# Patient Record
Sex: Male | Born: 1950 | Race: White | Hispanic: No | Marital: Married | Smoking: Never smoker
Health system: Southern US, Community
[De-identification: ages and names within clinical notes are randomized; demographics above are authoritative.]

## PROBLEM LIST (undated history)

## (undated) DIAGNOSIS — G4730 Sleep apnea, unspecified: Secondary | ICD-10-CM

## (undated) DIAGNOSIS — K219 Gastro-esophageal reflux disease without esophagitis: Secondary | ICD-10-CM

## (undated) DIAGNOSIS — M545 Low back pain, unspecified: Secondary | ICD-10-CM

## (undated) DIAGNOSIS — I341 Nonrheumatic mitral (valve) prolapse: Secondary | ICD-10-CM

## (undated) DIAGNOSIS — R0609 Other forms of dyspnea: Secondary | ICD-10-CM

## (undated) DIAGNOSIS — Z049 Encounter for examination and observation for unspecified reason: Secondary | ICD-10-CM

## (undated) DIAGNOSIS — M1990 Unspecified osteoarthritis, unspecified site: Secondary | ICD-10-CM

## (undated) DIAGNOSIS — M2550 Pain in unspecified joint: Secondary | ICD-10-CM

## (undated) DIAGNOSIS — R0600 Dyspnea, unspecified: Secondary | ICD-10-CM

## (undated) DIAGNOSIS — M549 Dorsalgia, unspecified: Secondary | ICD-10-CM

## (undated) DIAGNOSIS — D649 Anemia, unspecified: Secondary | ICD-10-CM

## (undated) DIAGNOSIS — J42 Unspecified chronic bronchitis: Secondary | ICD-10-CM

## (undated) HISTORY — DX: Dyspnea, unspecified: R06.00

## (undated) HISTORY — DX: Gastro-esophageal reflux disease without esophagitis: K21.9

## (undated) HISTORY — DX: Unspecified osteoarthritis, unspecified site: M19.90

## (undated) HISTORY — DX: Unspecified chronic bronchitis: J42

## (undated) HISTORY — DX: Other forms of dyspnea: R06.09

## (undated) HISTORY — DX: Low back pain, unspecified: M54.50

## (undated) HISTORY — DX: Nonrheumatic mitral (valve) prolapse: I34.1

## (undated) HISTORY — DX: Sleep apnea, unspecified: G47.30

## (undated) HISTORY — DX: Dorsalgia, unspecified: M54.9

## (undated) HISTORY — DX: Pain in unspecified joint: M25.50

## (undated) HISTORY — DX: Anemia, unspecified: D64.9

---

## 1991-01-04 DIAGNOSIS — J40 Bronchitis, not specified as acute or chronic: Secondary | ICD-10-CM

## 1991-01-04 HISTORY — DX: Bronchitis, not specified as acute or chronic: J40

## 2001-07-16 ENCOUNTER — Ambulatory Visit: Admit: 2001-07-16 | Disposition: A | Discharge status: Home / Self Care | Payer: Self-pay | Source: Ambulatory Visit

## 2003-01-04 HISTORY — PX: SHOULDER SURGERY: SHX246

## 2004-10-19 ENCOUNTER — Ambulatory Visit: Admission: RE | Admit: 2004-10-19 | Payer: Self-pay | Source: Ambulatory Visit | Admitting: Gastroenterology

## 2005-09-05 ENCOUNTER — Emergency Department: Admit: 2005-09-05 | Payer: Self-pay | Source: Emergency Department | Admitting: Emergency Medical Services

## 2008-12-12 ENCOUNTER — Ambulatory Visit
Admit: 2008-12-12 | Disposition: A | Discharge status: Home / Self Care | Payer: Self-pay | Source: Ambulatory Visit | Admitting: Gastroenterology

## 2009-01-03 DIAGNOSIS — K5790 Diverticulosis of intestine, part unspecified, without perforation or abscess without bleeding: Secondary | ICD-10-CM

## 2009-01-03 HISTORY — DX: Diverticulosis of intestine, part unspecified, without perforation or abscess without bleeding: K57.90

## 2009-01-13 ENCOUNTER — Ambulatory Visit: Payer: Self-pay

## 2009-01-13 ENCOUNTER — Ambulatory Visit: Admission: RE | Admit: 2009-01-13 | Payer: Self-pay | Source: Ambulatory Visit | Admitting: Gastroenterology

## 2010-12-21 NOTE — Op Note (Signed)
Introduction:[id number] Document ID: [id number] -- 60 year old male      patient presents for an outpatient Esophagogastroduodenoscopy on      01/13/2009.            Indications: GERD (530.81). Anemia.            Consent: The benefits, risks, and alternatives to the procedure were      discussed and informed consent was obtained from the patient.            Preparation: EKG, pulse, pulse oximetry, and blood pressure were monitored      throughout the procedure.            Medications: IVA anesthesia.            Procedure: The gastroscope was passed through the mouth under direct      visualization and was advanced with ease to the 2nd portion of the      duodenum. The scope was withdrawn and the mucosa was carefully examined.      The views were excellent. The patient's toleration of the procedure was      excellent.            Estimated Blood Loss: Negligible.            Findings:   Esophagus: A possible tongue of short-segment Barrett's      esophagus was found in the GE junction, spanning 10 mm.  Two cold forceps      biopsies were taken. There was a small hiatus hernia visible in the      esophagus.   Stomach: Mild non-erosive gastritis was found in the antrum      and body of the stomach. The mucosa appeared erythematous.  Two cold      forceps biopsies were taken.  Pylorus: The pylorus appeared to be normal,      symmetrical, and patent.  Duodenum: The duodenum appeared to be normal.            Unplanned Events: There were no unplanned events.            Summary: Barrett's esophagus was noted in the GE junction (530.85). Two      biopsies taken. A hiatus hernia was found in the esophagus (553.3). Mild      non-erosive gastritis was found in the antrum and body of the stomach      (535.50). Two biopsies taken. Normal, symmetrical, and patent pylorus.      Normal duodenum.            Recommendations: Raise the head of the bed 4 to 6 inches. Avoid smoking.      Avoid excess coffee, tea or other caffeinated  beverages. Avoid garments      that fit tightly through the abdomen. Avoid eating before bed. Continue      current medications as directed by the physician. EGD recommended in 2      years. Follow-up appointment with Niel Hummer, MD in 2-4 week.            Procedure Codes:            Performed By:      Version 1, electronically signed by Dr. Niel Hummer on 01/13/2009 at 11:50.

## 2010-12-21 NOTE — Op Note (Signed)
Introduction: [id number] Document ID: [id number] -- 60 year old male      patient presents for an outpatient Colonoscopy on 01/13/2009.            Indications: Screening for personal history of polyps (V12.72). Anemia.            Consent: The benefits, risks, and alternatives to the procedure were      discussed and informed consent was obtained from the patient.            Preparation: EKG, pulse, pulse oximetry, and blood pressure were monitored      throughout the procedure.            Medications: IVA anesthesia.            Rectal Exam: Normal rectal exam.            Procedure: The colonoscope was passed through the anus under direct      visualization and was advanced with ease to the cecum, confirmed by      landmarks. The scope was withdrawn and the mucosa was carefully examined.      The quality of the preparation was excellent. The views were excellent.      The patient's toleration of the procedure was excellent. Retroflexion was      performed in the rectum.            Estimated Blood Loss: Negligible.            Findings: There was evidence of moderately severe diverticulosis in the      sigmoid colon.  Small internal hemorrhoids were found. The hemorrhoids      were not bleeding.  Otherwise, the colon appeared to be normal.            Unplanned Events: There were no unplanned events.            Summary: Moderately severe diverticulosis (562.10) found in the sigmoid      colon. Internal hemorrhoids found (455.0).            Recommendations: Start high fiber diverticulosis diet that includes no      popcorn, seeds, or nuts. Follow-up appointment with Niel Hummer, MD in 2      weeks. Colonoscopy recommended in 5 years. Patient will be sent a reminder      letter.            Procedure Codes:            Performed By:      Version 1, electronically signed by Dr. Niel Hummer on 01/13/2009 at 12:08.

## 2011-01-04 HISTORY — PX: SHOULDER ARTHROSCOPY: SHX128

## 2011-01-04 HISTORY — PX: KNEE ARTHROSCOPY: SUR90

## 2011-10-10 ENCOUNTER — Ambulatory Visit: Payer: Enrolled Prime—HMO | Admitting: Rehabilitative and Restorative Service Providers"

## 2011-10-10 ENCOUNTER — Inpatient Hospital Stay
Payer: Enrolled Prime—HMO | Attending: Orthopaedic Surgery | Admitting: Rehabilitative and Restorative Service Providers"

## 2011-10-10 DIAGNOSIS — M25519 Pain in unspecified shoulder: Secondary | ICD-10-CM | POA: Insufficient documentation

## 2011-10-10 NOTE — PT/OT Therapy Note (Signed)
DAILY NOTE    Time In/Out: 6: 30- 7:25 Total Time: 45 Visit Number:  9    # of Authorized Visits: 16 Visit #: 1    Diagnosis (Treating/Medical): The encounter diagnosis was Shoulder joint pain.          Subjective:  Mike Scott's pain is Increases with certain Shld movement and is rated a 6/10.      Objective:   Treatment:  Therapeutic Exercise:Per flowsheet to improve: Strength     Modifications/Patient Education (Verbal and Tactile cues with correct form and to decrease momentum with ther- ex for improved muscle control.  )        Manual Therapy:  Myofascial release to increase muscle flexibility-  Rotator cuff muscle and tendon. Cross friction massage-bicep tendon to decrease pain.          Current Measurements (ROM, Strength, Girth, Outcomes, etc.):   InitialRight  AROM InitialRight  PROM   Right  AROM   Right PROM Shoulder InitialLeft AROM InitialLeft PROM   Left AROM   Left PROM   150  Deg ( last check)  160  Flexion       NT  NT  Extension       120  130  Abduction       T12  NT  Internal Rotation       82 deg  85  External Rotation                  (blank fields were intentionally left blank)  Initial R   R  Initial  L   L    3/5( pain) Shoulder Flexion      4-/5 Shoulder Abduction      NT Shoulder Extension      4-/5 Shoulder External Rotation      4/5 Shoulder Internal Rotation      NT Rhomboids      NT Middle Trapezius      NT Lower Trapezius      NT Biceps      NT Triceps     (blank fields were intentionally left blank)      Modalities: Ice Pack 10 min. Location RIGHT SHLD Position Supine and None  Therapy Rationale: Decrease Pain and Decrease Inflamation         Assessment (response to treatment) No complaint of increased sx after RX.        Progress towards functional goals: Pt with improving shoulder AROM needed for reaching high shelf and back for washing.    Patient requires continued skilled care to: increase shoulder strength for increased lifting tolerance.        Plan:  Continue with Plan of  Care UBE        Mike Scott, PT, DPT, [place] [address]  10/10/2011

## 2011-10-10 NOTE — PT/OT Exercise Plan (Signed)
Name: Mike Scott  Referring Physician: Mosetta Putt*  Diagnosis:   Encounter Diagnosis   Name Primary?   . Shoulder joint pain Yes    ICD 9 Code:  Shoulder pain    Precautions:   Date of Surgery:   MD Follow-up:           Exercise Flow Sheet    Exercise Specifics 10/7 Date            Shld flexion   Till 90 deg. X 10 1# bn             Shld scaption   Till 90 deg X 10 1#  bn             shld theraband ER    Red x 20   bn             Prone mid trap    X 10   bn             Prone rows    X 10   bn             Serratus punches    3# x 10 bn                                                                                                  Home Exercise Program                 (Initials = supervised exercise by clinician)

## 2011-10-14 ENCOUNTER — Inpatient Hospital Stay
Payer: Enrolled Prime—HMO | Attending: Orthopaedic Surgery | Admitting: Rehabilitative and Restorative Service Providers"

## 2011-10-14 DIAGNOSIS — M25519 Pain in unspecified shoulder: Secondary | ICD-10-CM | POA: Insufficient documentation

## 2011-10-14 NOTE — PT/OT Therapy Note (Signed)
DAILY NOTE    Time In/Out: 4: 30- 5:15 Total Time: 45 Visit Number:  10    # of Authorized Visits: 16 Visit #: 2    Diagnosis (Treating/Medical): There were no encounter diagnoses.          Subjective:  Mike Scott's pain is Increases with certain Shld movement and is rated a 4 /10.      Objective:   Treatment:  Therapeutic Exercise:Per flowsheet to improve: Strength     Modifications/Patient Education (Verbal and Tactile cues with correct muscle activation of scapular muscle with prone exercise and to avoid scapular elevation substitution with shoulder flexion and scaption exercise.).        Manual Therapy:  Myofascial release to increase muscle flexibility-  Rotator cuff muscle and tendon. Cross friction massage-bicep tendon to decrease pain.          Current Measurements (ROM, Strength, Girth, Outcomes, etc.):   + Neer test-right   Shoulder        Modalities: Ice Pack 10 min. Location RIGHT SHLD Position Supine and None  Therapy Rationale: Decrease Pain and Decrease Inflamation         Assessment (response to treatment) No complaint of increased sx after RX.        Progress towards functional goals:decreased pain level for increased   lifting tolerance.    Patient requires continued skilled care to: increase shoulder strength for increased lifting tolerance.        Plan:  Continue with Plan of Care - possible utra sound        [name], PT, DPT, [place] [address]  10/14/2011

## 2011-10-14 NOTE — PT/OT Exercise Plan (Signed)
Name: Mike Scott  Referring Physician: Erin Hearing Rober*  Diagnosis:   No diagnosis found. ICD 9 Code:  Shoulder pain    Precautions:   Date of Surgery:   MD Follow-up:           Exercise Flow Sheet    Exercise Specifics 10/7 10/11            Shld flexion   Till 90 deg. X 10 1# bn X 10 x 2set 1# bn            Shld scaption   Till 90 deg X 10 1#  bn X 10 x 2set 1# bn            shld theraband ER    Red x 20   bn Red x 20 bn            Prone mid trap    X 10   bn X 10 bn            Prone rows    X 10   bn X 10 bn            Serratus punches    3# x 10 bn 3# x 10 bn            UBE      5 min CW/CCW                                                                                Home Exercise Program                 (Initials = supervised exercise by clinician)

## 2011-10-20 ENCOUNTER — Inpatient Hospital Stay
Payer: Enrolled Prime—HMO | Attending: Orthopaedic Surgery | Admitting: Rehabilitative and Restorative Service Providers"

## 2011-10-20 DIAGNOSIS — M25519 Pain in unspecified shoulder: Secondary | ICD-10-CM | POA: Insufficient documentation

## 2011-10-20 NOTE — PT/OT Therapy Note (Signed)
DAILY NOTE    Time In/Out: 6:30- 7:30 Total Time: 45 Visit Number:  11    # of Authorized Visits: 16 Visit #: 3    Diagnosis (Treating/Medical): The encounter diagnosis was Shoulder joint pain.          Subjective:  Mike Scott's pain is improving, easier to lift      Objective:   Treatment:  Therapeutic Exercise:Per flowsheet to improve: Strength     Modifications/Patient Education (Verbal and Tactile cues with correct muscle activation of scapular muscle with prone exercise and to avoid scapular elevation substitution with shoulder flexion and scaption exercise.) Increased     resistance with there-ex to increase shoulder strength for increased   lifting tolerance. Added lower trap strengthening exercise for improving scapular strength for decreased right shoulder joint stress.          Manual Therapy:  Myofascial release to increase muscle flexibility-  Rotator cuff muscle and tendon. Cross friction massage-bicep tendon to decrease pain.          Current Measurements (ROM, Strength, Girth, Outcomes, etc.):   MMT- right                   MMT- left    Mid trap- 3+/5             Mid trap- 4-/5  Rhomboid- 4-/5          Rhomboid- 4/5  Low trap- 3-/5             Low trap- 3+/5        Modalities: Ice Pack 10 min. Location RIGHT SHLD Position Supine and None  Therapy Rationale: Decrease Pain and Decrease Inflamation         Assessment (response to treatment)  C/o shoulder muscle soreness with increased resistance with ther-ex.        Progress towards functional goals:decreased pain level for increased   lifting tolerance, see subjective    Patient requires continued skilled care to: increase shoulder strength for increased lifting tolerance.        Plan:  Continue with Plan of Care -         [name], PT, DPT, [place] [address]  10/20/2011

## 2011-10-20 NOTE — PT/OT Exercise Plan (Signed)
Name: Mike Scott  Referring Physician: Mosetta Putt*  Diagnosis:   Encounter Diagnosis   Name Primary?   . Shoulder joint pain Yes    ICD 9 Code:  Shoulder pain    Precautions:   Date of Surgery:   MD Follow-up:           Exercise Flow Sheet    Exercise Specifics 10/7 10/11 10/20/11           Shld flexion   Till 90 deg. X 10 1# bn X 10 x 2set 1# bn X 10 2# bn           Shld scaption   Till 90 deg X 10 1#  bn X 10 x 2set 1# bn X 10 x 2# bn           shld theraband ER    Red x 20   bn Red x 20 bn sidelying ER- 2# x10             Prone mid trap    X 10   bn X 10 bn X 10 1# bn           Prone rows    X 10   bn X 10 bn X 10 2# bn           Serratus punches    3# x 10 bn 3# x 10 bn 3# x10 bn           UBE      5 min CW/CCW 5 min CW/CCW           Prone low trap      X 5                                                              Home Exercise Program                 (Initials = supervised exercise by clinician)

## 2011-10-21 ENCOUNTER — Ambulatory Visit: Payer: Enrolled Prime—HMO | Admitting: Rehabilitative and Restorative Service Providers"

## 2011-10-24 ENCOUNTER — Inpatient Hospital Stay
Payer: Enrolled Prime—HMO | Attending: Orthopaedic Surgery | Admitting: Rehabilitative and Restorative Service Providers"

## 2011-10-24 DIAGNOSIS — M25519 Pain in unspecified shoulder: Secondary | ICD-10-CM | POA: Insufficient documentation

## 2011-10-24 NOTE — PT/OT Therapy Note (Signed)
DAILY NOTE    Time In/Out: 6:30- 7:30 Total Time: 45 Visit Number:  12    # of Authorized Visits: 9 Visit #: 1    Diagnosis (Treating/Medical): The encounter diagnosis was Shoulder joint pain.          Subjective:  Mike Scott's pain is improving, 2/10      Objective:   Treatment:  Therapeutic Exercise:Per flowsheet to improve: Strength     Modifications/Patient Education (Verbal and Tactile cues with avoiding elbow flexion with serratus punches and correct angle with scapular strengthening exercise)        Manual Therapy:  Myofascial release to increase muscle flexibility-  Rotator cuff muscle and tendon. Cross friction massage-bicep tendon to decrease pain.          Current Measurements (ROM, Strength, Girth, Outcomes, etc.):         Modalities: Ice Pack 10 min. Location RIGHT SHLD Position Supine and None  Therapy Rationale: Decrease Pain and Decrease Inflamation         Assessment (response to treatment)    Only c/o soreness with lower trap strengthening exercise.      Progress towards functional goals:    Patient requires continued skilled care to: increase shoulder strength for increased lifting tolerance.        Plan:  Continue with Plan of Care - flex bar        [name], PT, DPT, [place] [address]  10/24/2011

## 2011-10-24 NOTE — PT/OT Exercise Plan (Signed)
Name: Mike Scott  Referring Physician: Mosetta Putt*  Diagnosis:   Encounter Diagnosis   Name Primary?   . Shoulder joint pain Yes    ICD 9 Code:  Shoulder pain    Precautions:   Date of Surgery:   MD Follow-up:           Exercise Flow Sheet    Exercise Specifics 10/7 10/11 10/20/11 10/24/11          Shld flexion   Till 90 deg. X 10 1# bn X 10 x 2set 1# bn X 10 2# bn X 10 2# bn          Shld scaption   Till 90 deg X 10 1#  bn X 10 x 2set 1# bn X 10 x 2# bn x 10 x 2# bn          shld theraband ER    Red x 20   bn Red x 20 bn sidelying ER- 2# x10   sidelying ER- 2# x10          Prone mid trap    X 10   bn X 10 bn X 10 1# bn X 10 1# bn          Prone rows    X 10   bn X 10 bn X 10 2# bn X 10 2# bn          Serratus punches    3# x 10 bn 3# x 10 bn 3# x10 bn 3# x10 bn          UBE      5 min CW/CCW 5 min CW/CCW 5 min CW/CCW          Prone low trap      X 5 X 5                                                             Home Exercise Program                 (Initials = supervised exercise by clinician)

## 2011-10-27 ENCOUNTER — Inpatient Hospital Stay
Payer: Enrolled Prime—HMO | Attending: Orthopaedic Surgery | Admitting: Rehabilitative and Restorative Service Providers"

## 2011-10-27 DIAGNOSIS — M25519 Pain in unspecified shoulder: Secondary | ICD-10-CM | POA: Insufficient documentation

## 2011-10-27 NOTE — PT/OT Exercise Plan (Signed)
Name: Mike Scott  Referring Physician: Mosetta Putt*  Diagnosis:   Encounter Diagnosis   Name Primary?   . Shoulder joint pain Yes    ICD 9 Code:  Shoulder pain    Precautions:   Date of Surgery:   MD Follow-up:           Exercise Flow Sheet    Exercise Specifics 10/7 10/11 10/20/11 10/24/11 10/27/11         Shld flexion   Till 90 deg. X 10 1# bn X 10 x 2set 1# bn X 10 2# bn X 10 2# bn X 20 2# bn         Shld scaption   Till 90 deg X 10 1#  bn X 10 x 2set 1# bn X 10 x 2# bn x 10 x 2# bn X 20 2#bn         shld theraband ER    Red x 20   bn Red x 20 bn sidelying ER- 2# x10   sidelying ER- 2# x10 2#x 20 bn         Prone mid trap    X 10   bn X 10 bn X 10 1# bn X 10 1# bn X 10 1# bn         Prone rows    X 10   bn X 10 bn X 10 2# bn X 10 2# bn X 20 2# bn         Serratus punches    3# x 10 bn 3# x 10 bn 3# x10 bn 3# x10 bn 3#x 20 bn         UBE      5 min CW/CCW 5 min CW/CCW 5 min CW/CCW 5 min CW/CCW         Prone low trap      X 5 bn X 5 bn X 5 bn         Alphabet on ball         1 set bn           Flex bar      30 sec bn                          Home Exercise Program                 (Initials = supervised exercise by clinician)

## 2011-10-27 NOTE — PT/OT Therapy Note (Signed)
DAILY NOTE    Time In/Out: 6:30- 7:15 Total Time: 45 Visit Number:  13    # of Authorized Visits: 9 Visit #: 13    Diagnosis (Treating/Medical): The encounter diagnosis was Shoulder joint pain.          Subjective:  Mike Scott's shoulder pain doing pretty well      Objective:   Treatment:  Therapeutic Exercise:Per flowsheet to improve: Strength     Modifications/Patient Education (Verbal and Tactile cues minimal) .Added flex bar    Manual Therapy:  Myofascial release to increase muscle flexibility-  Rotator cuff muscle and tendon. Cross friction massage-bicep tendon to decrease pain.          Current Measurements (ROM, Strength, Girth, Outcomes, etc.):   MMT Right shoulder  Shoulder Flexion-4-/5 ( last check-3/5( pain) )     Shoulder Abduction -4-/5  To 4/5 ( last check-4-/5 )  Shoulder External Rotation   4-/5 to 4/5  ( last check-4-/5 )                   Assessment (response to treatment)    No complaint of increased sx after RX.      Progress towards functional goals: The patient with increased shoulder strength for increased lifting tolerance.      Patient requires continued skilled care to:      Plan:  Continue with Plan of Care -NV possible d/c        [name], PT, DPT, [place] [address]  10/27/2011

## 2011-10-31 ENCOUNTER — Inpatient Hospital Stay
Payer: Enrolled Prime—HMO | Attending: Orthopaedic Surgery | Admitting: Rehabilitative and Restorative Service Providers"

## 2011-10-31 DIAGNOSIS — M25519 Pain in unspecified shoulder: Secondary | ICD-10-CM | POA: Insufficient documentation

## 2011-10-31 NOTE — PT/OT Therapy Note (Signed)
DAILY NOTE    Time In/Out: 6:45- 7:30 Total Time: 45 Visit Number:  14    # of Authorized Visits: 9 Visit #: 14    Diagnosis (Treating/Medical): The encounter diagnosis was Shoulder joint pain.          Subjective:  Mike Scott's shoulder pain doing pretty well- 0-4/10      Objective:   Treatment:  Therapeutic Exercise:Per flowsheet to improve: Strength     Modifications/Patient Education (Verbal and Tactile cues minimal- to avoid shrugging substitution with shoulder flexion and scaption strengthening exercise).  To continue with HEP. Discussed progression in terms of reps and resistance in the future with HEP.    Manual Therapy:  Myofascial release to increase muscle flexibility-  Rotator cuff muscle and tendon. Cross friction massage-bicep tendon to decrease pain.          Current Measurements (ROM, Strength, Girth, Outcomes, etc.):   MMT Right shoulder  Shoulder Flexion-4-/5 ( last check-3/5( pain) )     Shoulder Abduction -4-/5  To 4/5 ( last check-4-/5 )  Shoulder External Rotation   4-/5 to 4/5  ( last check-4-/5 )                   Assessment (response to treatment)    1/10 after rx.      Progress towards functional goals: The patient able to lift gallon of milk with no  issues to fridge..      : Initial SPADI was 60%.  Final SPADI improved to 14%.  Final Pain Rating decreased from 17% to 17% and Satisfaction improved from 0/10 to  5/10.   Patient reports performing HEP some of the time.       Plan:  Discharged  from PT -goals met        [name], PT, DPT, [place] [address]  10/31/2011

## 2011-10-31 NOTE — PT/OT Exercise Plan (Signed)
Name: Mike Scott  Referring Physician: Mosetta Putt*  Diagnosis:   Encounter Diagnosis   Name Primary?   . Shoulder joint pain Yes    ICD 9 Code:  Shoulder pain    Precautions:   Date of Surgery:   MD Follow-up:           Exercise Flow Sheet    Exercise Specifics 10/7 10/11 10/20/11 10/24/11 10/27/11 10/31/11        Shld flexion   Till 90 deg. X 10 1# bn X 10 x 2set 1# bn X 10 2# bn X 10 2# bn X 20 2# bn X 20 2# bn        Shld scaption   Till 90 deg X 10 1#  bn X 10 x 2set 1# bn X 10 x 2# bn x 10 x 2# bn X 20 2#bn X 20 2#bn        shld theraband ER    Red x 20   bn Red x 20 bn sidelying ER- 2# x10   sidelying ER- 2# x10 2#x 20 bn 2#x 20 bn        Prone mid trap    X 10   bn X 10 bn X 10 1# bn X 10 1# bn X 10 1# bn X 10 1# bn        Prone rows    X 10   bn X 10 bn X 10 2# bn X 10 2# bn X 20 2# bn X 20 2# bn        Serratus punches    3# x 10 bn 3# x 10 bn 3# x10 bn 3# x10 bn 3#x 20 bn 3#x 20 bn        UBE      5 min CW/CCW 5 min CW/CCW 5 min CW/CCW 5 min CW/CCW 5 min CW/CCW        Prone low trap      X 5 bn X 5 bn X 5 bn X 5 bn        Alphabet on ball         1 set bn 1 set bn          Flex bar      30 sec bn 30 sec bn                         Home Exercise Program                 (Initials = supervised exercise by clinician)

## 2011-12-12 ENCOUNTER — Inpatient Hospital Stay: Payer: Enrolled Prime—HMO | Attending: Internal Medicine | Admitting: Rehabilitative and Restorative Service Providers"

## 2011-12-12 ENCOUNTER — Encounter: Payer: Self-pay | Admitting: Rehabilitative and Restorative Service Providers"

## 2011-12-12 VITALS — BP 145/88 | HR 74

## 2011-12-12 DIAGNOSIS — M545 Low back pain, unspecified: Secondary | ICD-10-CM | POA: Insufficient documentation

## 2011-12-12 NOTE — PT/OT Therapy Note (Signed)
INITIAL EVALUATION (Lumbar)      Name: Mike Scott Age: 61 y.o. Occupation: desk job SOC: 12/12/2011  Referring Physician: Maralyn Sago,* MD recheck: tbd DOS:   DOI: Onset of Problem / Injury: 11/21/11  Diagnosis (Treating/Medical): The encounter diagnosis was Low back pain.   # of Authorized Visits: 24 Visit # 1 today     SUBJECTIVE:    Mechanism of Injury: 11/21/11- hiking hills. Stuck on top- wrong way, had to use ropes with UE and slipper slopes. Right sided Low back pain.  Severe for 1 week, could not put on the pants comfortably. Much better now.      Patient's reason for seeking PT /Goal:    Past Medical History: No past medical history on file.  Medications: No outpatient prescriptions have been marked as taking for the 12/12/11 encounter (Clinical Support) with [name], PT.   Fall Risks: Low Fall Risk Comments: none 2013  Other Treatment/Prior Therapy: Yes -LB/ knee and shoulder  Prior Hospitalization: Yes - [facility] for shoulder surgery and knee surgery  Hand Dominance: Dominant Hand: Right Involved Side: Involved Side: Right   DiagnosticTests:    Outcome Measure:         Oswestry Score: 12 %          % PSFS Score: 23 % Rate Satisfaction with Current Function: 7/10  PLOF: No limitation with sitting in any chair.   Living Environment: Type of Residence: Multi-story home      Dwelling Entrance:     Patient lives with: Living Arrangements: Spouse/significant other    OBJECTIVE:    Vitals: BP: 145/88 mmHg Heart Rate: 74         Observation/Posture/Gait/Integumentary  Observation:   Posture: Deficits noted: Forward Head and Rounded Shoulders  Gait: Within functional limits  Integumentary: No wound, lesion or rash noted     AROM Hip  WFL   AROM: Lumbar Spine Pain  R L   Flexion 60  0  Flexion     Extension 10  0  Extension      R L R L Abduction     Side Bending 5 5 0  Exter. Rot.     Rotation     Inter. Rot.     (blank fields were intentionally left blank)    Repeated flexion/extension  centralizes symptoms? N/A    STRENGTH   Lumbar   MMT /5    IE R IE L R L  IE R IE  L R  L   L1/2 Hip Flex 4+/5 4+/5   Glute Medius       L3 Knee Ext 4+/5 4+/5    T.A. Activation fair      L4 Ankle DF 4+/5 4+/5   Heel/Toe Walk       L5 Toe Ex 4+/5 4+/5          S1 Knee Flex            (blank fields were intentionally left blank)    Palpation: mild tightness- L-PSM     R L   SLR NT NT   Crossed SLR NT NT   Quadrant Test NT NT   Thomas Test (+) (+)   OBER Test NT NT   Slump NT NT   FABER NT NT   SAG Sign NT NT   Hamstring Flexibility (+) (+)    -40 deg -40 deg     Treatment Initial Visit:  Evaluation  Manual-  Myofascial release to  increase muscle flexibility- L-PSM- 10'    Barriers to rehabilitation: None  Rehab Potential:good  Is patient aware of diagnosis: Yes  For Next Visit Add  Ther-ex    [name], PT, DPT, [place] [address]  12/12/2011  Time In/Out: 4:50- 5:30   Total Treatment Time:  40

## 2011-12-12 NOTE — PT/OT Plan of Care (Signed)
Plan of Care / Updated Plan of Care IPTC Medicare Provider #: [phone]                    Patient Name: Mike Scott  MRN: [id number]  DOI: Onset of Problem / Injury: 11/21/11 DOS: N/A  SOC: 12/12/2011         Diagnosis: The encounter diagnosis was Low back pain.  ICD-9 code:     ASSESSMENT: the patient is a 61 y.o. male who requires Physical Therapy for the following:  Impairments:  increased LBP, decreased LE muscle flexibility, decreased Abdominal muscle control (T.A. Activation)    Functional Limitations: right sided LBP. Not comfortable in sitting all chairs    Plan Of Care: Therapeutic Exercise and Soft Tissue/Joint Mobilization - Myofascial release to increase muscle flexibility      Frequency/Duration: 2 times a week for 2 weeks. Anticipated D/C date: 12/27/11    Goals:  Date (Body Area, Impairment Goal, Functional   Activity, Target Performance) Time Frame Status Date/  Initial   12/12/2011   Patient will demonstrate independence in prescribed HEP with proper form, sets and reps for safe discharge to an independent program.  2 weeks     12/12/2011  Decreased LBP 0-1/10 with increased Abdominal muscle control (T.A. Activation)- Fair+, improved LE muscle flexibility for ability to sit in all chairs without any difficulty 2weeks      Signature: [name], PT, DPT, [place] [address] Date: 12/12/2011    Signature: Maralyn Sago,* ____________________________ Date:

## 2011-12-15 ENCOUNTER — Inpatient Hospital Stay
Payer: Enrolled Prime—HMO | Attending: Orthopaedic Surgery | Admitting: Rehabilitative and Restorative Service Providers"

## 2011-12-15 DIAGNOSIS — M25519 Pain in unspecified shoulder: Secondary | ICD-10-CM | POA: Insufficient documentation

## 2011-12-15 NOTE — PT/OT Therapy Note (Signed)
DAILY NOTE    Time In/Out: 4:50 - 5:30 Total Time: 40 Visit Number:  2    Cer# of Authorized Visits: 24 Visit #: 2    Diagnosis (Treating/Medical): The encounter diagnosis was Low back pain.          Subjective:  Mike Scott's  No new concerns expressed  Functional Changes:     Objective:   Treatment:  Therapeutic Exercise:Per flowsheet to improve: Flexibility/ROM and Stabilization. Given copies      Modifications/Patient Education:         Manual Therapy:     Myofascial release to increase muscle flexibility - L-PSM/glut muscle-medius       Current Measurements (ROM, Strength, Girth, Outcomes, etc.):            Assessment (response to treatment); No complaint of increased sx after RX.        Progress towards functional goals:     Patient requires continued skilled care to:     Plan:  Continue with Plan of Care-  progress abdominal stabilization exercises, SLR abduction, IT band stretch      Mike Scott, PT, DPT, [place] [address]  12/15/2011

## 2011-12-15 NOTE — PT/OT Exercise Plan (Signed)
Name: Mike Scott  Referring Physician: Maralyn Sago,*  Diagnosis:   Encounter Diagnosis   Name Primary?   . Low back pain Yes        Precautions:  right TKR/ shoulder arthroscopic surgery Date of Surgery:   na MD Follow-up: TBD          Exercise Flow Sheet    Exercise Specifics 12/15/11 Date            Piriformis stretch      30 sec x 3 rep bn               Hamstring stretch with strap      30 sec x 3 rep bn                 Hip abduction/ adduction isometrics   with knee bend supine    10 sec x  10 rep bn                 Thomas hip flexor stretch    30 sec x 3 rep bn                                                                                                                                      Home Exercise Program                 (Initials = supervised exercise by clinician)

## 2011-12-19 ENCOUNTER — Inpatient Hospital Stay
Payer: Enrolled Prime—HMO | Attending: Orthopaedic Surgery | Admitting: Rehabilitative and Restorative Service Providers"

## 2011-12-19 DIAGNOSIS — M25519 Pain in unspecified shoulder: Secondary | ICD-10-CM | POA: Insufficient documentation

## 2011-12-19 NOTE — PT/OT Exercise Plan (Signed)
Name: Garfield Cornea  Referring Physician: Maralyn Sago,*  Diagnosis:   Encounter Diagnosis   Name Primary?   . Low back pain Yes        Precautions:  right TKR/ shoulder arthroscopic surgery Date of Surgery:   na MD Follow-up: TBD          Exercise Flow Sheet    Exercise Specifics 12/15/11 12/19/11            Piriformis stretch      30 sec x 3 rep bn   30 sec x 3 rep bn              Hamstring stretch with strap      30 sec x 3 rep bn   30 sec x 3 rep bn                Hip abduction/ adduction isometrics   with knee bend supine    10 sec x  10 rep bn   hep              Thomas hip flexor stretch    30 sec x 3 rep bn   30 sec x 3 rep bn                SLR flexion/abduction   X 10 3# bn/ x 10 2# bn            Knee to chest       30 sec x 3 rep bn                Abdominal muscle brace with LE cycle   1' bn              sidelying ITB stretch   2' bn                                                               Home Exercise Program                 (Initials = supervised exercise by clinician)

## 2011-12-19 NOTE — PT/OT Therapy Note (Signed)
DAILY NOTE    Time In/Out: 4:50 - 5:30 Total Time: 40 Visit Number:  3    Cer# of Authorized Visits: 24 Visit #: 3    Diagnosis (Treating/Medical): The encounter diagnosis was Low back pain.          Subjective:  Mike Scott's  Ready to be d/c to HEP. Very minimal , intermittent pain    Functional Changes:     Objective:   Treatment:  Therapeutic Exercise:Per flowsheet to improve: Flexibility/ROM and Stabilization. Note additional exercise, given copies   progress abdominal stabilization exercises, SLR abduction, IT band stretch.  To continue with HEP/ posture awareness; discussed progression with HEP in terms of reps and resistance.             Manual Therapy:     Myofascial release to increase muscle flexibility - L-PSM/glut muscle-medius/ hip flexor muscles       Current Measurements (ROM, Strength, Girth, Outcomes, etc.):     T.A. Muscle activation- Fair       Assessment (response to treatment);  Minimal c/o symptom 1/10 after rx.        Progress towards functional goals:  Able to sit in chairs as long as watching proper posture         Plan: d/c'd per  the patient request        [name], PT, DPT, [place] [address]  12/19/2011

## 2011-12-22 ENCOUNTER — Ambulatory Visit: Payer: Enrolled Prime—HMO | Admitting: Rehabilitative and Restorative Service Providers"

## 2011-12-26 ENCOUNTER — Ambulatory Visit: Payer: Enrolled Prime—HMO | Admitting: Rehabilitative and Restorative Service Providers"

## 2011-12-29 ENCOUNTER — Ambulatory Visit: Payer: Enrolled Prime—HMO

## 2011-12-29 NOTE — Pre-Procedure Instructions (Signed)
No labs required per anesthesia

## 2012-01-05 ENCOUNTER — Encounter
Admission: RE | Disposition: A | Discharge status: Home / Self Care | Payer: Self-pay | Source: Ambulatory Visit | Attending: Gastroenterology

## 2012-01-05 ENCOUNTER — Encounter: Payer: Self-pay | Admitting: Anesthesiology

## 2012-01-05 ENCOUNTER — Ambulatory Visit
Admission: RE | Admit: 2012-01-05 | Discharge: 2012-01-05 | Disposition: A | Discharge status: Home / Self Care | Payer: Enrolled Prime—HMO | Source: Ambulatory Visit | Attending: Gastroenterology | Admitting: Gastroenterology

## 2012-01-05 ENCOUNTER — Ambulatory Visit: Payer: Enrolled Prime—HMO | Admitting: Anesthesiology

## 2012-01-05 ENCOUNTER — Ambulatory Visit: Payer: Enrolled Prime—HMO | Admitting: Gastroenterology

## 2012-01-05 ENCOUNTER — Ambulatory Visit: Payer: Self-pay

## 2012-01-05 DIAGNOSIS — K219 Gastro-esophageal reflux disease without esophagitis: Secondary | ICD-10-CM | POA: Insufficient documentation

## 2012-01-05 DIAGNOSIS — K2940 Chronic atrophic gastritis without bleeding: Secondary | ICD-10-CM | POA: Insufficient documentation

## 2012-01-05 HISTORY — DX: Encounter for examination and observation for unspecified reason: Z04.9

## 2012-01-05 HISTORY — PX: EGD: SHX3789

## 2012-01-05 SURGERY — DONT USE, USE 1095-ESOPHAGOGASTRODUODENOSCOPY (EGD), DIAGNOSTIC
Anesthesia: Anesthesia General

## 2012-01-05 MED ORDER — LACTATED RINGERS IV SOLN
INTRAVENOUS | Status: DC | PRN
Start: 2012-01-05 — End: 2012-01-05

## 2012-01-05 MED ORDER — PROPOFOL INFUSION 10 MG/ML
INTRAVENOUS | Status: DC | PRN
Start: 2012-01-05 — End: 2012-01-05
  Administered 2012-01-05: 120 mg via INTRAVENOUS

## 2012-01-05 MED ORDER — FENTANYL CITRATE 0.05 MG/ML IJ SOLN
INTRAMUSCULAR | Status: DC | PRN
Start: 2012-01-05 — End: 2012-01-05
  Administered 2012-01-05: 50 ug via INTRAVENOUS

## 2012-01-05 MED ORDER — ONDANSETRON HCL 4 MG/2ML IJ SOLN
4.0000 mg | Freq: Once | INTRAMUSCULAR | Status: DC | PRN
Start: 2012-01-05 — End: 2012-01-05

## 2012-01-05 MED ORDER — LACTATED RINGERS IV SOLN
INTRAVENOUS | Status: DC
Start: 2012-01-05 — End: 2012-01-05

## 2012-01-05 MED ORDER — LACTATED RINGERS IV SOLN
INTRAVENOUS | Status: DC
Start: 2012-01-05 — End: 2012-01-05
  Administered 2012-01-05: 1000 mL via INTRAVENOUS

## 2012-01-05 MED ORDER — LIDOCAINE HCL 2 % IJ SOLN
INTRAMUSCULAR | Status: DC | PRN
Start: 2012-01-05 — End: 2012-01-05
  Administered 2012-01-05: 80 mg

## 2012-01-05 SURGICAL SUPPLY — 14 items
CONTAINER HISTOLOGY 60 ML 30 ML GRADUATE LEAK RESISTANT O RING PREFILL (Procedure Accessories) IMPLANT
FORCEP BIOPSY 240CM RADIALJAW (Instrument) ×1 IMPLANT
GLOVE SRG 8.5 BGL M LTX STRL PF TXTR (Glove) ×1
GLOVE SURGICAL 8.5 BIOGEL M POWDER FREE (Glove) ×1
GLOVE SURGICAL 8.5 BIOGEL M POWDER FREE TEXTURE BEAD CUFF NONPYROGENIC (Glove) ×1 IMPLANT
GOWN ISL PP PE REG LG LF FULL BCK NK TIE (Gown) ×2
GOWN ISOLATION REGULAR LARGE FULL BACK NECK TIE ELASTIC CUFF (Gown) ×1 IMPLANT
SOL FORMALIN 10% PREFILL 30ML (Procedure Accessories) ×4
SPONGE GAUZE L4 IN X W4 IN 4 PLY HIGH (Sponge) ×1
SPONGE GAUZE L4 IN X W4 IN 4 PLY NONWOVEN LINT FREE CURITY RAYON (Sponge) ×1 IMPLANT
SPONGE GZE RYN PLSTR CRTY 4X4IN LF NS 4 (Sponge) ×1
WATER STERILE PLASTIC POUR BOTTLE 1000 (Irrigation Solutions) ×1
WATER STERILE PLASTIC POUR BOTTLE 1000 ML (Irrigation Solutions) ×2 IMPLANT
WATER STRL 1000ML PLS PR BTL LF (Irrigation Solutions) ×1

## 2012-01-05 NOTE — OR PreOp (Signed)
Rash right antecubital improved  Dr Alisa Graff notified re: the rash and the improvement

## 2012-01-05 NOTE — OR PreOp (Signed)
Pt brought CPAP machine  CPAP brought to PACU when pt went back to procedure room  CPAP labeled with pts name

## 2012-01-05 NOTE — Anesthesia Postprocedure Evaluation (Signed)
Satisfactory anesthetic recovery. Vital signs stable, see nursing flow sheet. Volume status, analgesia and antiemetic adequate. Please discharge from PACU once criteria meet. No apparent complications

## 2012-01-05 NOTE — Transfer of Care (Signed)
Pt. awake. Breathing well. Vital signs stable, see PACU  nursing flow sheet. Detailed report given to PACU RN.

## 2012-01-05 NOTE — OR PreOp (Signed)
BP cuff on right arm  Pt started to scratch  - right antecubital area  Red flat area - right antecubital area   BP cuff removed    Checked site after 10-15 minute - red area still present but not as as red  Pt states it does not itch any more

## 2012-01-05 NOTE — Discharge Instructions (Signed)
Upper GI Endoscopy  Upper GI endoscopy allows your doctor to look directly into the beginning of your gastrointestinal (GI) tract. The esophagus, stomach, and duodenum (the first part of the small intestine) make up the upper GI tract.     During endoscopy,a long,flexible tube is used to view the inside of your upper GI tract.    Before the Exam  Follow these and any other instructions you are given before your endoscopy. If you don't follow the doctor's instructions carefully, the test may need to be cancelled or done over.   Do not eat or drink anything after midnight the night before your exam. If your exam is in the afternoon, drink only clear liquids in the morning, and do not eat or drink anything for 6 hours before the exam.   Bring your x-rays and any other test results you have.   Because you will be sedated, arrange for an adult to drive you home after the exam.   Tell your healthcare provider before the exam if you are taking any medications or have any medical problems.  The Procedure   You lie on the endoscopy table.   Your throat may be numbed with a spray or gargle. You are given sedating (relaxing) medication through an intravenous (IV) line.   You swallow the endoscope. This is thinner than most pieces of food that you swallow. It will not affect your breathing. The medication helps keep you from gagging.   Air is inserted to expand your GI tract. It can make you burp.   The endoscope carries images of your upper GI tract to a video screen. If you are awake, you may be able to look at the images.   After the procedure is done, you rest for a time. An adult must drive you home.  Call your doctor if you have:   Black or tarry stools; blood in your stool.   Fever.   Persistent pain in your abdomen.    [address], [place], [place] [address]. All rights reserved. This information is not intended as a substitute for professional medical care. Always follow  your healthcare professional's instructi  Gastritis (Adult)    Gastritis is an irritation of the stomach lining. It can be acute (recent) or chronic (lasting a long time). Gastritis can be caused by overuse of alcohol or anti-inflammatory medications (such as aspirin, ibuprofen, or prednisone). H pyloriinfection can also cause chronic gastritis.  Gastritis can cause a dull ache or burning pain in the upper abdomen. Other symptoms include nausea, vomiting, loss of appetite, and belching or bloating. Blood in the vomit or stools (red or black) is a sign of bleeding in the stomach. This requires immediate medical attention.  Tests for H pyloriare used to screen for bacterial infection. If no infection is found, gastritis can be treated by stopping the cause and treating with antacids plus an acid blocker medication. If H pylori infection is found, antibiotics will also be prescribed. Persons 55 years and older may undergo other tests before treatment is started.  Two common tests are used to evaluate your symptoms. An upper GI series is an x-ray taken after you drink a chalky liquid called barium. This coats the stomach and allows the doctor to view any problems in the stomach on the x-ray. Another test is called endoscopy, during which a long thin tube called an endoscope is passed down your throat to the stomach. A camera at the end of  the scope allows the doctor to view inside the stomach to check the cause of your symptoms.  Home Care:   Take the prescribed acid blocker medication for the full course of treatment even if you begin to feel better sooner. This medication can take up to several days to fully control your symptoms. If you can't afford the prescribed medication, you can try over-the-counter acid blockers, such as Pepcid AC, Tagamet, Zantac, or Aciphex. If these do not relieve your symptoms, a stronger acid-blocker can be tried, such as Prilosec OTC.   If you have been prescribed an antibiotic to  treat H pyloriinfection, finish the full course of medication. Do so even if you begin to feel better sooner. If you stop the medication too soon, the infection can return and be harder to treat.   You can use antacids, such as Tums, Rolaids, Mylanta, or Maalox, for pain. This will be useful the first few days after starting acid blockers when the blockers haven't started working yet. Follow the directions on the label. Liquid antacids may work better than tablets. Note that antacids can interfere with absorption of certain medications. Specifically, do not take Tagamet (cimetidine), Zantac (ranitidine), or Carafate (sucralfate) within 1 hour of taking an antacid. Talk with your pharmacist if you have any questions.   Symptoms of gastritis can be worsened by certain foods. Limit or avoid fatty, fried, and spicy foods, as well as coffee, chocolate, mint, and foods with high acid content such as tomatoes and citrus fruit and juices (orange, grapefruit, lemon).   Avoid alcohol, caffeine, and tobacco, which can delay healing.   Avoid aspirin and anti-inflammatory medications such as ibuprofen (Advil, Motrin) and naproxen (Naprosyn, Aleve). Acetaminophen (Tylenol) is safe to use. Do not take more than the amount listed on the label.  Follow Up  with your doctor, or as advised by our staff. Further testing may be needed. If you do not improve over the next 4 days, contact your doctor. If you had an x-ray, CT scan, or ECG (electrocardiogram), it will be reviewed by a specialist. You'll be notified of any new findings that affect your care.  Get Prompt Medical Attention  if any of the following occur:   Stomach pain gets worse or moves to the lower right abdomen (appendix area)   Chest pain appears or gets worse, or spreads to the back, neck, shoulder, or arm   Frequent vomiting (can't keep down liquids)   Blood in the stool or vomit (red or black in color)   Feeling weak or dizzy, fainting, or trouble  breathing   Fever of 100.41F (38C) or higher, or as directed by your healthcare provider   [address], [address] [place], [place] [address]. All rights reserved. This information is not intended as a substitute for professional medical care. Always follow your healthcare professional's instructions.    ons.

## 2012-01-05 NOTE — H&P (Signed)
GI PRE PROCEDURE NOTE    Proceduralist Comments:   Review of Systems and Past Medical / Surgical History performed: Yes     Indications:GERD     Previous Adverse Reaction to Anesthesia or Sedation (if yes, describe): No    Physical Exam / Laboratory Data (If applicable)   Airway Classification: Class II    General: Alert and cooperative  Lungs: Lungs clear to auscultation  Cardiac: RRR, normal S1S2.    Abdomen: Soft, non tender. Normal active bowel sounds  Other:     No labs drawn    American Society of Anesthesiologists (ASA) Physical Status Classification:   ASA 2 - Patient with mild systemic disease with no functional limitations    Planned Sedation:   Deep sedation with anesthesia    Attestation:   Mike Scott has been reassessed immediately prior to the procedure and is an appropriate candidate for the planned sedation and procedure. Risks, benefits and alternatives to the planned procedure and sedation have been explained to the patient or guardian:  yes        Signed by: Nakyia Dau

## 2012-01-05 NOTE — Anesthesia Preprocedure Evaluation (Signed)
Anesthesia Evaluation    AIRWAY    Mallampati: II    TM distance: >3 FB  Neck ROM: full     CARDIOVASCULAR    normal     DENTAL         PULMONARY    pulmonary exam normal     OTHER FINDINGS                  Anesthesia Plan    ASA 3             informed consent obtained

## 2012-01-06 ENCOUNTER — Encounter: Payer: Self-pay | Admitting: Gastroenterology

## 2012-02-14 ENCOUNTER — Ambulatory Visit: Payer: Enrolled Prime—HMO | Admitting: Rehabilitative and Restorative Service Providers"

## 2012-03-05 ENCOUNTER — Ambulatory Visit: Payer: Enrolled Prime—HMO | Admitting: Rehabilitative and Restorative Service Providers"

## 2012-03-08 ENCOUNTER — Inpatient Hospital Stay: Payer: Enrolled Prime—HMO | Attending: Internal Medicine | Admitting: Rehabilitative and Restorative Service Providers"

## 2012-03-08 VITALS — BP 130/80

## 2012-03-08 DIAGNOSIS — M542 Cervicalgia: Secondary | ICD-10-CM | POA: Insufficient documentation

## 2012-03-08 NOTE — PT/OT Plan of Care (Signed)
Plan of Care / Updated Plan of Care IPTC Medicare Provider #: [phone]                    Patient Name: Mike Scott  MRN: [id number]  DOI: Onset of Problem / Injury: 03/09/11 DOS: N/A  SOC: 03/08/2012         Diagnosis: There were no encounter diagnoses.  ICD-9 code:  ASSESSMENT: the patient is a 62 y.o. male who requires Physical Therapy for the following:  Impairments: Increased neck pain, decreased cervical AROM, decreased deep neck flexor endurance       Functional Limitations: bilateral neck pain. Left>Right. Sometime radicular UE bilateral. Sleep disturbed 2 hours. Difficulty turning neck with driving.  Reading limited 10 to 15 minutes      Plan Of Care: Electrical Stimulation, Instruction in HEP, Traction, Ultrasound, Therapeutic Exercise and Soft Tissue/Joint Mobilization-  Myofascial release to increase muscle flexibility, Joint mob Grade 2-3 to decreased pain/ stiffness       Frequency/Duration: 2 times a week for 8 weeks. Anticipated D/C date: 05/08/12    Goals:  Date (Body Area, Impairment Goal, Functional   Activity, Target Performance) Time Frame Status Date/  Initial   03/08/2012   Increase active cervical rotation to  10 to 15 degrees for increased visual field to allow safe changing lanes and backing up vehicle while driving.   8 weeks     03/08/2012  The patient with decreased cervical   pain 1-2/10 for being able to sleep without disturbance   8 weeks     03/08/2012  Increased deep neck flexor endurance - 40 second for being able to read 30 minutes       8 weeks                     Signature: [name], PT, DPT, [place] [address] Date: 03/08/2012    Signature: Maralyn Sago,* ____________________________ Date:

## 2012-03-08 NOTE — PT/OT Therapy Note (Signed)
INITIAL EVALUATION (Cervical)      Name: Mike Scott Age: 62 y.o. 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   # of Authorized Visits: 24 Visit # 1 today     SUBJECTIVE:    Mechanism of Injury: About one year - progressively worse. No specific incident. History of T2 to  T6 vertebral compression fracture- 2000- accident      Patient's reason for seeking PT /Goal:  improve cervical movement and decrease pain    Past Medical History:   Past Medical History   Diagnosis Date   . Backache    . Joint pain    . Bronchitis      ongoing in winter   . DOE (dyspnea on exertion)    . Sleep apnea      wears CPAP   . Bronchitis, chronic    . Bronchitis 1993     suseptable from Nov-Feb.   . GERD (gastroesophageal reflux disease)    . Diverticulosis 2011   . Anemia      thalasemia   . Arthritis      knees & shoulders   . Low back pain      stenosis   . Disease ruled out after examination      Rule out Barrett's esophagus     Medications: No outpatient prescriptions have been marked as taking for the 03/08/12 encounter (Clinical Support) with [name], PT.   Fall Risks: Low Fall Risk Comments: none 2013 -2014  Other Treatment/Prior Therapy: Yes - Outpatient for LB/ knee /shoulder  Prior Hospitalization: None- 2014  Hand Dominance: Dominant Hand: Right Involved Side: Involved Side: Bilateral   DiagnosticTests: none recently    Outcome Measure:             NDI Score: 30      % PSFS Score: 40 % Rate Satisfaction with Current Function: 2/10  PLOF: No issue with sleeping, turning neck with driving or reading limitation.  Living Environment: Type of Residence: Multi-story home      Dwelling Entrance:     Patient lives with: Living Arrangements: Spouse/significant other    OBJECTIVE:    Vitals: BP: 130/80 mmHg       HR-  72    Observation/Posture/Gait/Integumentary  Observation:Posture: Deficits noted: Forward Head and Rounded Shoulders    Integumentary: Other: old skin scar- neck     AROM Shoulder  WFL   AROM: Cervical Spine Pain  R L   Flexion 30    Flexion     Extension 15    Extension      R L R L Abduction     Rotation 25 15   Exter. Rot.     Side Bending     Inter. Rot.     (blank fields were intentionally left blank)    Repeated flexion/extension centralizes symptoms? No        STRENGTH   Cervical   MMT /5    IE R IE L R L  IE R IE L R  L   C1/2 Neck Flex     C7 Wrist Flex 4+/5 4+/5     Neck Ext     C7 Wrist Ext 4+/5 4+/5     C3 Neck Sidebend     C4 Upper Trap       Neck Rotation  C4 Mid Trap       Shoulder Flex 4-/5 4+/5   C4 Lower Trap       Shoulder Ext     Rhomboid       C5 Shld Abd 4+/5 4+/5   Serratus       C6 Biceps 4+/5 4+/5          C7 Triceps 4+/5 4+/5          (blank fields were intentionally left blank)         Palpation: Pain to palpation: and tightness- bilateral UT muscle/ lev scap muscle/ C-psm/ T-psm    Joint Mobility Assessment: hypomobile and tender bilateral uni C3 to C7 End feel: Firm    Sensation to Light touch: Intact    Special Tests/Neurological Screen:     R L   Spurling (+) (+)   Distraction Test (+) (+)   Upper Limb Tension Test (+) (+)   Thoracic Outlet NT NT   Deep Neck Flexor Endurance 22 seconds NT   Quadrant Test NT NT          Treatment Initial Visit:  Evaluation      Manual   Myofascial release to increase muscle flexibility- C-PSM/ UT muscle/ lev scap muscle    Modalities: None  Barriers to rehabilitation: None  Rehab Potential:fair  Is patient aware of diagnosis: Yes  For Next Visit Add     [name], PT, DPT, [place] [address]  03/08/2012    Time In/Out:  5:35 - 6:20 Total Treatment Time:  14'

## 2012-03-12 ENCOUNTER — Inpatient Hospital Stay: Payer: Enrolled Prime—HMO | Attending: Internal Medicine | Admitting: Rehabilitative and Restorative Service Providers"

## 2012-03-12 DIAGNOSIS — M542 Cervicalgia: Secondary | ICD-10-CM | POA: Insufficient documentation

## 2012-03-12 NOTE — PT/OT Therapy Note (Signed)
DAILY NOTE    Time In/Out: 6:00 - 6:50 Total Time: 59' Visit Number:  2    # of Authorized Visits: 24 Visit #: 2    Diagnosis (Treating/Medical): The encounter diagnosis was Neck pain.          Subjective:  Mike Scott's no new concerns expressed.    Functional Changes:    Objective:   Treatment:  Therapeutic Exercise:Per flowsheet to improve: Flexibility/ROM and Strength, given copies.         Modifications/Patient Education:        Manual Therapy:Myofascial release to increase muscle flexibility- bilateral Upper trap muscle/ Levator scap/ C-PSM. Joint mob Grade 2 bilateral uni C3 to C7 to decrease pain symptom.          Current Measurements (ROM, Strength, Girth, Outcomes, etc.):       Modalities: Electrical Stimulation with Ice: Premod 15 min. Location  bil Upper trap Position Supine 90/90, None and Ultrasound Parameters: 1.5 w/cm2 5 min. at 100% Location cervical Position Seated-5' rolled into MT  Therapy Rationale: Decrease Pain and Increase Extensiblility         Assessment (response to treatment): No complaint of increased sx after RX.        Progress towards functional goals:    Patient requires continued skilled care to:   Plan:  Continue with Plan of Care    Mike Scott, PT, DPT, [place] [address]  03/12/2012

## 2012-03-12 NOTE — PT/OT Exercise Plan (Signed)
Name: Mike Scott  Referring Physician: Maralyn Sago,*  Diagnosis:   Encounter Diagnosis   Name Primary?   . Neck pain Yes        Precautions:   Date of Surgery:    MD Follow-up:           Exercise Flow Sheet    Exercise Specifics 3/10 Date            bilateral  Upper trap muscle/ Levator scap stretching    30 sec x 3 rep bn               meeks 2, 3    5 sec x 10 rep bn                                                                                                                                                                        Home Exercise Program                 (Initials = supervised exercise by clinician)

## 2012-03-15 ENCOUNTER — Inpatient Hospital Stay: Payer: Enrolled Prime—HMO | Attending: Internal Medicine | Admitting: Rehabilitative and Restorative Service Providers"

## 2012-03-15 DIAGNOSIS — M542 Cervicalgia: Secondary | ICD-10-CM | POA: Insufficient documentation

## 2012-03-15 NOTE — PT/OT Therapy Note (Signed)
DAILY NOTE    Time In/Out: 5:50 - 6:50 Total Time: 30' Visit Number:  3    # of Authorized Visits: 24 Visit #: 3    Diagnosis (Treating/Medical): The encounter diagnosis was Neck pain.          Subjective:  Mike Scott's felt better after last visit but c/o increased pain with ther-ex.  Functional Changes:     Objective:   Treatment:  Therapeutic Exercise:Per flowsheet to improve: Flexibility/ROM and Strength- 3' rolled into MT     Modifications/Patient Education:  Verbal and tactile cueing-  Min to moderate with correct form. Hold of on stretching exercise secondary increased symptom.    Manual Therapy:   Myofascial release to increase muscle flexibility- bilateral Upper trap muscle/ Levator scap/ C-PSM/ T-PSM            Current Measurements (ROM, Strength, Girth, Outcomes, etc.):    R L   Rotation 25 15         Modalities: Electrical Stimulation with Ice: Premod 15 min. Location bi UT muscle Position Supine 90/90 and Traction with Ice Cervical Intermittent 12 lbs. for 10 min. Time: 30 seconds min. on and 10 seconds off.  Therapy Rationale: Decrease Pain and Increase Extensiblility         Assessment (response to treatment):  c/o some muscle soreness/ mild HA after Rx.         Progress towards functional goals: No changes to report    Patient requires continued skilled care to:  Increase active cervical rotation to  10 to 15 degrees for increased visual field to allow safe changing lanes and backing up vehicle while driving,  with decrease cervical pain 1-2/10 for being able to sleep without disturbance and increase deep neck flexor endurance - 40 second for being able to read 30 minutes            Plan:  Continue with Plan of Care    [name], PT, DPT, [place] [address]  03/15/2012

## 2012-03-15 NOTE — PT/OT Exercise Plan (Signed)
Name: Garfield Cornea  Referring Physician: Maralyn Sago,*  Diagnosis:   Encounter Diagnosis   Name Primary?   . Neck pain Yes        Precautions:   Date of Surgery:    MD Follow-up:           Exercise Flow Sheet    Exercise Specifics 03/12/12 03/15/12            bilateral  Upper trap muscle/ Levator scap stretching    30 sec x 3 rep bn   hold            meeks 2, 3    5 sec x 10 rep bn   5 sec x 10 rep bn              Shoulder rolls and shoulder retraction     X 10 bn                                                                                                                                                    Home Exercise Program                 (Initials = supervised exercise by clinician)

## 2012-03-20 ENCOUNTER — Inpatient Hospital Stay: Payer: Enrolled Prime—HMO | Attending: Internal Medicine | Admitting: Rehabilitative and Restorative Service Providers"

## 2012-03-20 DIAGNOSIS — M542 Cervicalgia: Secondary | ICD-10-CM | POA: Insufficient documentation

## 2012-03-20 NOTE — PT/OT Exercise Plan (Signed)
Name: Mike Scott  Referring Physician: Maralyn Sago,*  Diagnosis:   Encounter Diagnosis   Name Primary?   . Neck pain Yes        Precautions:   Date of Surgery:    MD Follow-up:           Exercise Flow Sheet    Exercise Specifics 03/12/12 03/15/12 03/20/12           bilateral  Upper trap muscle/ Levator scap stretching    30 sec x 3 rep bn   hold            meeks 2, 3    5 sec x 10 rep bn   5 sec x 10 rep bn   5 sec x 10 rep bn             Shoulder rolls and shoulder retraction     X 10 bn X 10 bn                                                                                                                                                   Home Exercise Program                 (Initials = supervised exercise by clinician)

## 2012-03-20 NOTE — PT/OT Therapy Note (Signed)
DAILY NOTE    Time In/Out: 4:30 - 5:40 Total Time: 103' Visit Number:  4    # of Authorized Visits: 24 Visit #: 4    Diagnosis (Treating/Medical): The encounter diagnosis was Neck pain.          Subjective:  Mike Scott's  no new concerns expressed.  .  Functional Changes:     Objective:   Treatment:  Therapeutic Exercise:Per flowsheet to improve: Flexibility/ROM and Strength- 3' rolled into MT     Modifications/Patient Education:  Verbal and tactile cueing-  Min to moderate with correct form.    Emphasized compliance with posture awareness.  Manual Therapy:  Myofascial release to increase muscle flexibility- bilateral Upper trap muscle/ Levator scap/ C-PSM/ T-PSM            Current Measurements (ROM, Strength, Girth, Outcomes, etc.):         Modalities:  Electrical Stimulation with Ice: Premod 15 min. Location- bil Upper trap Position Supine 90/90, None and Ultrasound Parameters: 1.5 w/cm2 5 min. at 100% Location cervical Position Seated.  Traction with Ice Cervical Intermittent 14 lbs. for 10 min. Time: 30 seconds min. on and 10 seconds off.     Therapy Rationale: Decrease Pain and Increase Extensiblility         Assessment (response to treatment):   No complaint of increased sx after RX.        Progress towards functional goals:    Patient requires continued skilled care to:  Increase active cervical rotation to  10 to 15 degrees for increased visual field to allow safe changing lanes and backing up vehicle while driving,  with decrease cervical pain 1-2/10 for being able to sleep without disturbance and increase deep neck flexor endurance - 40 second for being able to read 30 minutes            Plan:  Continue with Plan of Care    [name], PT, DPT, [place] [address]  03/20/2012

## 2012-03-22 ENCOUNTER — Inpatient Hospital Stay: Payer: Enrolled Prime—HMO | Attending: Internal Medicine | Admitting: Rehabilitative and Restorative Service Providers"

## 2012-03-22 DIAGNOSIS — M542 Cervicalgia: Secondary | ICD-10-CM | POA: Insufficient documentation

## 2012-03-22 NOTE — PT/OT Exercise Plan (Signed)
Name: Mike Scott  Referring Physician: Maralyn Sago,*  Diagnosis:   Encounter Diagnosis   Name Primary?   . Neck pain Yes        Precautions:   Date of Surgery:    MD Follow-up:           Exercise Flow Sheet    Exercise Specifics 03/12/12 03/15/12 03/20/12 03/22/12          bilateral  Upper trap muscle/ Levator scap stretching    30 sec x 3 rep bn   hold  30 sec x 3 rep bn            meeks 2, 3    5 sec x 10 rep bn   5 sec x 10 rep bn   5 sec x 10 rep bn   5 sec x 10 rep bn            Shoulder rolls and shoulder retraction     X 10 bn X 10 bn x10 bn                                                                                                                                                  Home Exercise Program                 (Initials = supervised exercise by clinician)

## 2012-03-22 NOTE — PT/OT Therapy Note (Signed)
DAILY NOTE    Time In/Out: 5:30 - 6:40 Total Time: 80' Visit Number:  5    # of Authorized Visits: 24 Visit #: 4    Diagnosis (Treating/Medical): The encounter diagnosis was Neck pain.          Subjective:  Mike Scott's  Some decreased in neck pain.  .  Functional Changes:     Objective:   Treatment:  Therapeutic Exercise:Per flowsheet to improve: Flexibility/ROM and Strength- resumption of cervical flexibility exercises-5'     Modifications/Patient Education:  Verbal and tactile cueing-  Min to moderate with correct form. eg with correct angle with cervical flexibility exercise.   Emphasized compliance with posture awareness.  Manual Therapy:  Myofascial release to increase muscle flexibility- bilateral Upper trap muscle/ Levator scap/ C-PSM/ T-PSM      Current Measurements (ROM, Strength, Girth, Outcomes, etc.):   ( @IE ) R L   Rotation 25 15         ( @ present) R L   Rotation 35 30     Modalities:  Electrical Stimulation with Ice: Premod 15 min. Location- bil Upper trap Position Supine 90/90, None and Ultrasound Parameters: 1.5 w/cm2 5 min. at 100% Location cervical Position Seated.  Traction with Ice Cervical Intermittent 14 lbs. for 10 min. Time: 30 seconds min. on and 10 seconds off.     Therapy Rationale: Decrease Pain and Increase Extensiblility         Assessment (response to treatment):   c/o some muscle soreness after Rx.        Progress towards functional goals: Increase active cervical rotation for increased visual field to allow safe changing lanes and backing up vehicle while driving    Patient requires continued skilled care to:  Increase active cervical rotation to 10  degrees for increased visual field to allow safe changing lanes and backing up vehicle while driving,  with decrease cervical pain 1-2/10 for being able to sleep without disturbance and increase deep neck flexor endurance - 40 second for being able to read 30 minutes            Plan:  Continue with Plan of Care    [name], PT,  DPT, [place] [address]  03/22/2012

## 2012-03-26 ENCOUNTER — Ambulatory Visit: Payer: Enrolled Prime—HMO | Admitting: Rehabilitative and Restorative Service Providers"

## 2012-03-29 ENCOUNTER — Ambulatory Visit: Payer: Enrolled Prime—HMO | Admitting: Rehabilitative and Restorative Service Providers"

## 2012-04-26 ENCOUNTER — Inpatient Hospital Stay: Payer: Enrolled Prime—HMO | Attending: Internal Medicine | Admitting: Rehabilitative and Restorative Service Providers"

## 2012-04-26 DIAGNOSIS — M542 Cervicalgia: Secondary | ICD-10-CM | POA: Insufficient documentation

## 2012-04-26 NOTE — PT/OT Therapy Note (Signed)
DAILY NOTE    Time In/Out: 5:30 - 6:30 Total Time: 60' Visit Number:  5    # of Authorized Visits:   Visit #:      Diagnosis (Treating/Medical): There were no encounter diagnoses.          Subjective:  Mike Scott-   Not been able to get into PT secondary lack of time slots available.  .  Functional Changes:     Objective:   Treatment:  Therapeutic Exercise:Per flowsheet to improve: Flexibility/ROM and Strength- resumption of cervical flexibility exercises  Assisted warm up - UBE-5'  Modifications/Patient Education:  Verbal and tactile cueing-  Min to moderate with correct form. eg with chin tucks - the patient doing cervical flexion instead for retraction.    Emphasized compliance with posture awareness.  Manual Therapy:  Myofascial release to increase muscle flexibility- bilateral Upper trap muscle/ Levator scap/ C-PSM/ T-PSM. sustained natural apophyseal glide C3/ C4/ C5 with left rotation for increased cervical rotation AROM      Current Measurements (ROM, Strength, Girth, Outcomes, etc.):   ( @IE ) R L   Rotation 25 15         ( @ present) R L   Rotation 30 25     Modalities:   Traction with Ice Cervical Intermittent 15 lbs. for 15 min. Time: 30 seconds min. on and 10 seconds off.     Therapy Rationale: Decrease Pain and Increase Extensiblility         Assessment (response to treatment):   No complaint of increased sx after RX.        Progress towards functional goals:   Patient requires continued skilled care to:  Increase active cervical rotation to 10  degrees for increased visual field to allow safe changing lanes and backing up vehicle while driving,  with decrease cervical pain 1-2/10 for being able to sleep without disturbance and increase deep neck flexor endurance - 40 second for being able to read 30 minutes        Plan:  Continue with Plan of Care    [name], PT, DPT, [place] [address]  04/26/2012

## 2012-04-26 NOTE — PT/OT Exercise Plan (Signed)
Name: Mike Scott  Referring Physician: Maralyn Sago,*  Diagnosis:   No diagnosis found.     Precautions:   Date of Surgery:    MD Follow-up:           Exercise Flow Sheet    Exercise Specifics 03/12/12 03/15/12 03/20/12 03/22/12 04/26/12         bilateral  Upper trap muscle/ Levator scap stretching    30 sec x 3 rep bn   hold  30 sec x 3 rep bn   30 sec x 3 rep bn           meeks 2, 3    5 sec x 10 rep bn   5 sec x 10 rep bn   5 sec x 10 rep bn   5 sec x 10 rep bn   5 sec x 10 rep bn  chin tucks         Shoulder rolls and shoulder retraction     X 10 bn X 10 bn x10 bn X 10 bn           UBE      5' bn                                                                                                                                Home Exercise Program                 (Initials = supervised exercise by clinician)

## 2012-04-30 ENCOUNTER — Inpatient Hospital Stay: Payer: Enrolled Prime—HMO | Attending: Internal Medicine | Admitting: Rehabilitative and Restorative Service Providers"

## 2012-04-30 DIAGNOSIS — M542 Cervicalgia: Secondary | ICD-10-CM | POA: Insufficient documentation

## 2012-04-30 NOTE — PT/OT Exercise Plan (Signed)
Name: Mike Scott  Referring Physician: Maralyn Sago,*  Diagnosis:   No diagnosis found.     Precautions:   Date of Surgery:    MD Follow-up:           Exercise Flow Sheet    Exercise Specifics 03/12/12 03/15/12 03/20/12 03/22/12 04/26/12 04/30/12        bilateral  Upper trap muscle/ Levator scap stretching    30 sec x 3 rep bn   hold  30 sec x 3 rep bn   30 sec x 3 rep bn   30 sec x 3 rep bn          meeks 2, 3    5 sec x 10 rep bn   5 sec x 10 rep bn   5 sec x 10 rep bn   5 sec x 10 rep bn   5 sec x 10 rep bn  chin tucks 5 sec x 10 rep bn  Chin tucks        Shoulder rolls and shoulder retraction     X 10 bn X 10 bn x10 bn X 10 bn X 10 bn          UBE      5' bn 5' bn        Prone bilateral  Shoulder extension         X 20 2# bn        Prone bilateral mid trap         X 10 2# bn                                                                                             Home Exercise Program                 (Initials = supervised exercise by clinician)

## 2012-04-30 NOTE — PT/OT Therapy Note (Signed)
DAILY NOTE    Time In/Out: 5:30 - 6:30 Total Time: 58' Visit Number:  6    # of Authorized Visits: 12 Visit #: 2    Diagnosis (Treating/Medical): There were no encounter diagnoses.          Subjective:  Mike Scott-    Neck feeling better after last visit  .  Functional Changes:     Objective:   Treatment:  Therapeutic Exercise:Per flowsheet to improve: Flexibility/ROM and Strength. Note additional exercise, given copies to increase scapular muscle strength   to decrease spine stress.  Assisted warm up - UBE-5'  Modifications/Patient Education:  Verbal and tactile cueing-  Min to moderate with correct form.    Manual Therapy:  Myofascial release to increase muscle flexibility- bilateral Upper trap muscle/ Levator scap/ C-PSM/ T-PSM. sustained natural apophyseal glide C3/ C4/ C5 with left rotation for increased cervical rotation AROM      Current Measurements (ROM, Strength, Girth, Outcomes, etc.):     Modalities:   Traction with Ice Cervical Intermittent 15 lbs. for 15 min. Time: 30 seconds min. on and 10 seconds off.     Therapy Rationale: Decrease Pain and Increase Extensiblility         Assessment (response to treatment):   No complaint significant cervical sx after RX.        Progress towards functional goals:   Patient requires continued skilled care to:  Increase active cervical rotation to 10  degrees for increased visual field to allow safe changing lanes and backing up vehicle while driving,  with decrease cervical pain 1-2/10 for being able to sleep without disturbance and increase deep neck flexor endurance - 40 second for being able to read 30 minutes        Plan:  Continue with Plan of Care    [name], PT, DPT, [place] [address]  04/30/2012

## 2012-05-07 ENCOUNTER — Inpatient Hospital Stay: Payer: Enrolled Prime—HMO | Attending: Internal Medicine | Admitting: Rehabilitative and Restorative Service Providers"

## 2012-05-07 DIAGNOSIS — M542 Cervicalgia: Secondary | ICD-10-CM | POA: Insufficient documentation

## 2012-05-07 NOTE — PT/OT Therapy Note (Signed)
DAILY NOTE    Time In/Out: 5:30 - 6:30 Total Time: 58' Visit Number:  7    # of Authorized Visits: 12 Visit #: 3    Diagnosis (Treating/Medical): The encounter diagnosis was Neck pain.          Subjective:  Mike Scott's-  Neck feeling better.  Functional Changes:     Objective:   Treatment:  Therapeutic Exercise:Per flowsheet to improve: Flexibility/ROM and Strength.   Assisted warm up - UBE-5'  Modifications/Patient Education:  Verbal and tactile cueing-  Min to moderate with correct form. Eg. eg correct UE position with scapular strengthening exercise for proper muscle activation.      Manual Therapy:  Myofascial release to increase muscle flexibility- bilateral Upper trap muscle/ Levator scap/ C-PSM/ T-PSM. sustained natural apophyseal glide C3/ C4/ C5 with left rotation for increased cervical rotation AROM  Current Measurements (ROM, Strength, Girth, Outcomes, etc.):   ( @IE ) R L   Rotation 25 15         ( @ present) R L   Rotation 35 35        Modalities:   Traction with Ice Cervical Intermittent 16 lbs. for 15 min. Time: 30 seconds min. on and 10 seconds off.     Therapy Rationale: Decrease Pain and Increase Extensiblility         Assessment (response to treatment):   No complaint significant cervical sx after RX.        Progress towards functional goals: Increase active cervical rotation  for increased visual field to allow safe changing lanes and backing up vehicle while driving  Patient requires continued skilled care to:  Increase active cervical rotation to 5  degrees for increased visual field to allow safe changing lanes and backing up vehicle while driving,  with decrease cervical pain 1-2/10 for being able to sleep without disturbance and increase deep neck flexor endurance - 40 second for being able to read 30 minutes        Plan:  Continue with Plan of Care    [name], PT, DPT, [place] [address]  05/07/2012

## 2012-05-07 NOTE — PT/OT Exercise Plan (Signed)
Name: Mike Scott  Referring Physician: Maralyn Sago,*  Diagnosis:   Encounter Diagnosis   Name Primary?   . Neck pain Yes        Precautions:   Date of Surgery:    MD Follow-up:           Exercise Flow Sheet    Exercise Specifics 03/12/12 03/15/12 03/20/12 03/22/12 04/26/12 04/30/12 05/07/12       bilateral  Upper trap muscle/ Levator scap stretching    30 sec x 3 rep bn   hold  30 sec x 3 rep bn   30 sec x 3 rep bn   30 sec x 3 rep bn   30 sec x 3 rep bn         meeks 2, 3    5 sec x 10 rep bn   5 sec x 10 rep bn   5 sec x 10 rep bn   5 sec x 10 rep bn   5 sec x 10 rep bn  chin tucks 5 sec x 10 rep bn  Chin tucks 5 sec x 10 rep bn         Shoulder rolls and shoulder retraction     X 10 bn X 10 bn x10 bn X 10 bn X 10 bn X 10 bn         UBE      5' bn 5' bn 5' bn       Prone bilateral  Shoulder extension         X 20 2# bn X 20 2# bn       Prone bilateral mid trap         X 10 2# bn X 10 2# bn                                                                                            Home Exercise Program                 (Initials = supervised exercise by clinician)

## 2012-05-10 ENCOUNTER — Ambulatory Visit: Payer: Enrolled Prime—HMO | Admitting: Rehabilitative and Restorative Service Providers"

## 2012-09-12 ENCOUNTER — Ambulatory Visit: Payer: Enrolled Prime—HMO

## 2012-09-13 ENCOUNTER — Ambulatory Visit: Payer: Enrolled Prime—HMO

## 2012-09-14 ENCOUNTER — Encounter: Payer: Self-pay | Admitting: Anesthesiology

## 2012-09-14 ENCOUNTER — Encounter
Admission: RE | Disposition: A | Discharge status: Home / Self Care | Payer: Self-pay | Source: Ambulatory Visit | Attending: Gastroenterology

## 2012-09-14 ENCOUNTER — Ambulatory Visit
Admission: RE | Admit: 2012-09-14 | Discharge: 2012-09-14 | Disposition: A | Discharge status: Home / Self Care | Payer: Enrolled Prime—HMO | Source: Ambulatory Visit | Attending: Gastroenterology | Admitting: Gastroenterology

## 2012-09-14 ENCOUNTER — Ambulatory Visit: Payer: Enrolled Prime—HMO | Admitting: Anesthesiology

## 2012-09-14 ENCOUNTER — Ambulatory Visit: Payer: Enrolled Prime—HMO | Admitting: Gastroenterology

## 2012-09-14 DIAGNOSIS — K5730 Diverticulosis of large intestine without perforation or abscess without bleeding: Secondary | ICD-10-CM | POA: Insufficient documentation

## 2012-09-14 DIAGNOSIS — K219 Gastro-esophageal reflux disease without esophagitis: Secondary | ICD-10-CM | POA: Insufficient documentation

## 2012-09-14 DIAGNOSIS — I059 Rheumatic mitral valve disease, unspecified: Secondary | ICD-10-CM | POA: Insufficient documentation

## 2012-09-14 DIAGNOSIS — R948 Abnormal results of function studies of other organs and systems: Secondary | ICD-10-CM | POA: Insufficient documentation

## 2012-09-14 DIAGNOSIS — K648 Other hemorrhoids: Secondary | ICD-10-CM | POA: Insufficient documentation

## 2012-09-14 DIAGNOSIS — M129 Arthropathy, unspecified: Secondary | ICD-10-CM | POA: Insufficient documentation

## 2012-09-14 DIAGNOSIS — G4730 Sleep apnea, unspecified: Secondary | ICD-10-CM | POA: Insufficient documentation

## 2012-09-14 HISTORY — PX: COLONOSCOPY: SHX174

## 2012-09-14 SURGERY — DONT USE, USE 1094-COLONOSCOPY, DIAGNOSTIC (SCREENING)
Anesthesia: Anesthesia General | Site: Anus

## 2012-09-14 MED ORDER — OXYCODONE-ACETAMINOPHEN 5-325 MG PO TABS
1.0000 | ORAL_TABLET | Freq: Once | ORAL | Status: DC | PRN
Start: 2012-09-14 — End: 2012-09-14

## 2012-09-14 MED ORDER — LACTATED RINGERS IV SOLN
INTRAVENOUS | Status: DC
Start: 2012-09-14 — End: 2012-09-14

## 2012-09-14 MED ORDER — PROPOFOL INFUSION 10 MG/ML
INTRAVENOUS | Status: DC | PRN
Start: 2012-09-14 — End: 2012-09-14
  Administered 2012-09-14: 140 ug/kg/min via INTRAVENOUS

## 2012-09-14 MED ORDER — PROMETHAZINE HCL 25 MG/ML IJ SOLN
6.2500 mg | Freq: Once | INTRAMUSCULAR | Status: DC | PRN
Start: 2012-09-14 — End: 2012-09-14

## 2012-09-14 MED ORDER — ONDANSETRON HCL 4 MG/2ML IJ SOLN
4.0000 mg | Freq: Once | INTRAMUSCULAR | Status: DC | PRN
Start: 2012-09-14 — End: 2012-09-14

## 2012-09-14 MED ORDER — FENTANYL CITRATE 0.05 MG/ML IJ SOLN
25.0000 ug | INTRAMUSCULAR | Status: DC | PRN
Start: 2012-09-14 — End: 2012-09-14

## 2012-09-14 MED ORDER — HYDROMORPHONE HCL PF 1 MG/ML IJ SOLN
0.5000 mg | INTRAMUSCULAR | Status: DC | PRN
Start: 2012-09-14 — End: 2012-09-14

## 2012-09-14 MED ORDER — PROPOFOL 10 MG/ML IV EMUL
INTRAVENOUS | Status: DC | PRN
Start: 2012-09-14 — End: 2012-09-14
  Administered 2012-09-14: 75 mg via INTRAVENOUS
  Administered 2012-09-14: 150 mg via INTRAVENOUS

## 2012-09-14 MED ORDER — LACTATED RINGERS IV SOLN
INTRAVENOUS | Status: DC | PRN
Start: 2012-09-14 — End: 2012-09-14

## 2012-09-14 MED ORDER — PROPOFOL 10 MG/ML IV EMUL
INTRAVENOUS | Status: AC
Start: 2012-09-14 — End: ?
  Filled 2012-09-14: qty 200

## 2012-09-14 MED ORDER — MEPERIDINE HCL 25 MG/ML IJ SOLN
25.0000 mg | INTRAMUSCULAR | Status: DC | PRN
Start: 2012-09-14 — End: 2012-09-14

## 2012-09-14 SURGICAL SUPPLY — 11 items
CNTNR SPEC W LLDPE 16OZ LEK SHTR RST (Procedure Accessories) ×1
CONTAINER SPEC LLDPE 16OZ W LF NS LEK (Procedure Accessories) ×1
CONTAINER SPECIMEN C16 OZ WIDE LEAK SHATTER RESISTANT SNAP ON LID (Procedure Accessories) ×2 IMPLANT
GLOVE NITRILE PREMIERPRO MED (Glove) ×6 IMPLANT
JELLY KY LUBRICATNG 2 OZ FLIP (Procedure Accessories) ×2 IMPLANT
SYRINGE 50 ML GRADUATE NONPYROGENIC DEHP (Syringes, Needles) ×1
SYRINGE 50 ML GRADUATE NONPYROGENIC DEHP FREE PVC FREE BD MEDICAL (Syringes, Needles) ×1 IMPLANT
SYRINGE MED 50ML LF STRL GRAD N-PYRG (Syringes, Needles) ×1
TUBING CONNECTING STERILE 10FT (Tubing) ×1
TUBING SUCTION ID3/16 IN L10 FT (Tubing) ×1
TUBING SUCTION ID3/16 IN L10 FT NONCONDUCTIVE STRAIGHT MALE FEMALE (Tubing) ×1 IMPLANT

## 2012-09-14 NOTE — Anesthesia Preprocedure Evaluation (Signed)
Anesthesia Evaluation    AIRWAY    Mallampati: III    TM distance: >3 FB  Neck ROM: full  Mouth Opening:full   CARDIOVASCULAR    cardiovascular exam normal, regular and normal       DENTAL    No notable dental hx     PULMONARY    pulmonary exam normal and clear to auscultation     OTHER FINDINGS                  Pre-evaluation Note Incomplete - DO NOT USE FOR CLINICAL DECISIONS    Anesthesia Plan    ASA 3     general                     Detailed anesthesia plan: general IV  Monitors/Adjuncts: other    Post Op: other  Post op pain management: per surgeon    informed consent obtained      pertinent labs reviewed

## 2012-09-14 NOTE — Anesthesia Postprocedure Evaluation (Signed)
Anesthesia Post Evaluation    Patient: Mike Scott    Procedures performed: Procedure(s) with comments:  COLONOSCOPY - COLONOSCOPY  W/IVA    Anesthesia type: General TIVA    Patient location:Phase II PACU    Last vitals:   Filed Vitals:    09/14/12 0847   BP: 82/45   Pulse: 69   Temp: 98.3 F (36.8 C)   Resp: 16   SpO2: 97%       Post pain: Patient not complaining of pain, continue current therapy      Mental Status:awake    Respiratory Function: tolerating room air    Cardiovascular: stable    Nausea/Vomiting: patient not complaining of nausea or vomiting    Hydration Status: adequate    Post assessment: no apparent anesthetic complications, no reportable events and no evidence of recall

## 2012-09-14 NOTE — Discharge Instructions (Addendum)
Colonoscopy Discharge Instructions  General Instructions:  1. Following sedation, your judgement, perception, and coordination are considered impaired. Even though you may feel awake and alert, you are considered legally intoxicated. Therefore, until the next morning;   Do not Drive   Do not operate appliances or equipment that requires reaction time (e.g.Stove, electrical tools, machinery)   Do not sign legal documents or be involved in important decisions.   Do not smoke if alone   Do not drink alcoholic beverages   Go directly home and rest for several hours before resuming your routine activities.   It is highly recommended to have a responsible adult stay with you for the next 24 hours    2. Tenderness, swelling or pain may occur at the IV site where you received sedation. If you experience this, apply warm soaks to the area. Notify your physician if this persists.    Instructions Specific To Procedures - Report To Physician Any Of The Following:    Colon/Sigmoidoscopy/Proctoscopy   1. Severe and persistent abdominal pain/bloating which does not subside within 2-3 hours   2. Large amount of rectal bleeding (some mucosal blood streaking may occur, especially if biopsy or polypectomy was done or if hemorrhoids are present.   3. Nausea/vomitting   4. Fevers/Chills within 24 hours after procedure. Temp>101deg F    Additional Discharge Instructions  Your diet after the procedure:  Start with something light (Toast, Jello, Soup, Etc.), Then Resume to Regular Diet as Tolerated. Nothing Spicy, Greasy or Fried Foods Today.    If you have questions or problems contact your MD immediately. If you need immediate attention, call your MD, 911 and/or go to nearest emergency room.

## 2012-09-14 NOTE — Transfer of Care (Signed)
Anesthesia Transfer of Care Note    Patient: Mike Scott    Procedures performed: Procedure(s) with comments:  COLONOSCOPY - COLONOSCOPY  W/IVA    Anesthesia type: General TIVA    Patient location:Phase II PACU    Last vitals:   Filed Vitals:    09/14/12 0847   BP: 82/45   Pulse: 69   Temp: 98.3 F (36.8 C)   Resp: 16   SpO2: 97%       Post pain: Patient not complaining of pain, continue current therapy      Mental Status:awake    Respiratory Function: tolerating room air    Cardiovascular: stable    Nausea/Vomiting: patient not complaining of nausea or vomiting    Hydration Status: adequate    Post assessment: no apparent anesthetic complications, no reportable events and no evidence of recall

## 2012-09-14 NOTE — H&P (Signed)
GI PRE PROCEDURE NOTE    Proceduralist Comments:   Review of Systems and Past Medical / Surgical History performed: Yes     Indications:abnormal CT    Previous Adverse Reaction to Anesthesia or Sedation (if yes, describe): No    Physical Exam / Laboratory Data (If applicable)   Airway Classification: Class II    General: Alert and cooperative  Lungs: Lungs clear to auscultation  Cardiac: RRR, normal S1S2.    Abdomen: Soft, non tender. Normal active bowel sounds  Other:     No labs drawn    American Society of Anesthesiologists (ASA) Physical Status Classification:   ASA 2 - Patient with mild systemic disease with no functional limitations    Planned Sedation:   Deep sedation with anesthesia    Attestation:   Mike Scott has been reassessed immediately prior to the procedure and is an appropriate candidate for the planned sedation and procedure. Risks, benefits and alternatives to the planned procedure and sedation have been explained to the patient or guardian:  yes        Signed by: Nishan Ovens

## 2012-09-20 ENCOUNTER — Encounter: Payer: Self-pay | Admitting: Gastroenterology

## 2013-03-07 ENCOUNTER — Ambulatory Visit: Payer: Enrolled Prime—HMO | Admitting: Rehabilitative and Restorative Service Providers"

## 2013-03-21 ENCOUNTER — Encounter: Payer: Self-pay | Admitting: Rehabilitative and Restorative Service Providers"

## 2013-03-21 ENCOUNTER — Inpatient Hospital Stay: Payer: Enrolled Prime—HMO | Attending: Internal Medicine | Admitting: Rehabilitative and Restorative Service Providers"

## 2013-03-21 VITALS — BP 116/79 | HR 70

## 2013-03-21 DIAGNOSIS — M542 Cervicalgia: Secondary | ICD-10-CM | POA: Insufficient documentation
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
## 2013-03-21 NOTE — PT/OT Plan of Care (Signed)
Plan of Care / Updated Plan of Care IPTC Medicare Provider #: [id number]                Patient Name: Mike Scott  MRN: [id number]  DOI: Onset of Problem / Injury: 01/17/13 DOS: N/A  SOC: 03/21/2013    Diagnosis:     ICD-9-CM   1. Neck pain 723.1       ASSESSMENT: the patient is a 63 y.o. male presenting with cervical pain who requires Physical Therapy for the following:  Impairments:  Increased cervical /thoracic pain, decreased cervical AROM,    decreased deep neck flexor endurance test.      Functional Limitations: Cervical and thoracic pain. Difficulty turning neck with driving, sitting limited- 5 to 10 minutes comfortably secondary neck fatigue/ pain.    Plan Of Care: Electrical Stimulation, Instruction in HEP, Ultrasound, Therapeutic Exercise and Soft Tissue/Joint Mobilization - Myofascial release to increase muscle flexibility.      Frequency/Duration: 2 times a week for 6 weeks. Anticipated D/C date: 05/04/13    Goals:  Date (Body Area, Impairment Goal, Functional   Activity, Target Performance) Time Frame Status Date/  Initial   03/21/2013 Increased deep neck flexor endurance - 30 seconds for patient able to sit 20 minutes with decreased pain 0-1/10    6 weeks  Initial Eval    03/21/2013   Increase active cervical rotation to 10 degrees for increased visual field to allow safe changing lanes and backing up vehicle while driving.  6 weeks  Initial Eval      Signature: [name], PT, DPT, [place] [address] Date: 03/21/2013    Signature: Maralyn Sago,* ____________________________ Date:

## 2013-03-29 ENCOUNTER — Inpatient Hospital Stay: Payer: Enrolled Prime—HMO | Attending: Internal Medicine | Admitting: Rehabilitative and Restorative Service Providers"

## 2013-03-29 DIAGNOSIS — M542 Cervicalgia: Secondary | ICD-10-CM | POA: Insufficient documentation

## 2013-03-29 NOTE — PT/OT Therapy Note (Signed)
DAILY NOTE   03/29/2013     Time In/Out: 4:15 - 5:10 Total Time: 41' Visit Number: 2    # of Authorized Visits: 24 Visit #: 2    Diagnosis (Treating/Medical):     ICD-9-CM   1. Neck pain 723.1           Subjective:  Baldwin's no new concerns expressed.     Functional Changes:     Objective:   Treatment:  Therapeutic Exercise:Per flowsheet to improve: Flexibility/ROM and Stabilization.- Chin tucks 10 sec x  10 rep bn; bilateral  Upper trap muscle/ Levator scap stretching- 30 sec x 3 rep bn             Modifications/Patient Education:         Manual Therapy: Myofascial release to increase muscle flexibility-C-PSM/T-PSM/ medial scap muscle/bilateral  Upper trap muscle/ Levator scap.    Modalities: Traction- Cervical Intermittent 15 lbs. for 15 min. Time: 30 seconds min. on and 10 seconds off.  Therapy Rationale: Increase Extensiblility      Current Measurements (ROM, Strength, Girth, Outcomes, etc.):            Assessment (response to treatment):   No complaint of increased sx after RX.      Progress towards functional goals:     Patient requires continued skilled care to:    Plan:  Continue with Plan of Care    [name], PT, DPT, [place] [address]  03/29/2013

## 2013-04-01 ENCOUNTER — Inpatient Hospital Stay: Payer: Enrolled Prime—HMO | Attending: Internal Medicine | Admitting: Rehabilitative and Restorative Service Providers"

## 2013-04-01 DIAGNOSIS — M542 Cervicalgia: Secondary | ICD-10-CM | POA: Insufficient documentation

## 2013-04-01 NOTE — PT/OT Therapy Note (Signed)
DAILY NOTE   04/01/2013     Time In/Out: 5:30 - 6:30 Total Time: 20' Visit Number: 3    # of Authorized Visits: 24 Visit #: 3    Diagnosis (Treating/Medical):     ICD-9-CM   1. Neck pain 723.1           Subjective:  Mike Scott's felt better after last visit.  Functional Changes:     Objective:   Treatment:  Therapeutic Exercise:Per flowsheet to improve: Flexibility/ROM and Stabilization.- Chin tucks 10 sec x  10 rep bn; bilateral  Upper trap muscle/ Levator scap stretching- 30 sec x 3 rep bn, sidebending bilateral 5 sec x 5 rep bn               Modifications/Patient Education:  Min to moderate with correct form,  Verbal and tactile cueing- correct cervical angle with cervical flexibility exercises, to avoid lifting head up with chin tucks.        Manual Therapy: Myofascial release to increase muscle flexibility-C-PSM/T-PSM/ medial scap muscle/bilateral  Upper trap muscle/ Levator scap.    Modalities: Traction- Cervical Intermittent 15 lbs. for 15 min. Time: 30 seconds min. on and 10 seconds off.  Therapy Rationale: Increase Extensiblility      Current Measurements (ROM, Strength, Girth, Outcomes, etc.):     Cervical R L   Rotation 35 30          Assessment (response to treatment):   No complaint of increased sx after RX.      Progress towards functional goals: no changes to report      Patient requires continued skilled care to:  Goals:   Date  (Body Area, Impairment Goal, Functional   Activity, Target Performance)  Time Frame  Status  Date/   Initial    03/21/2013  Increased deep neck flexor endurance - 30 seconds for patient able to sit 20 minutes with decreased pain 0-1/10  6 weeks      03/21/2013  Increase active cervical rotation to 10 degrees for increased visual field to allow safe changing lanes and backing up vehicle while driving.  6 weeks              Plan:  Continue with Plan of Care    [name], PT, DPT, [place] [address]  04/01/2013

## 2013-04-04 ENCOUNTER — Inpatient Hospital Stay: Payer: Enrolled Prime—HMO | Attending: Internal Medicine | Admitting: Rehabilitative and Restorative Service Providers"

## 2013-04-04 DIAGNOSIS — M542 Cervicalgia: Secondary | ICD-10-CM

## 2013-04-04 NOTE — PT/OT Therapy Note (Signed)
DAILY NOTE   04/04/2013     Time In/Out: 5:30 - 6:30 Total Time: 26' Visit Number: 4    # of Authorized Visits: 24 Visit #: 3    Diagnosis (Treating/Medical):     ICD-9-CM   1. Neck pain 723.1           Subjective:  Mike Scott's neck feeling better     Functional Changes:     Objective:   Treatment:  Therapeutic Exercise:Per flowsheet to improve: Flexibility/ROM and Stabilization.- Chin tucks 10 sec x  10 rep bn; bilateral  Upper trap muscle/ Levator scap stretching- 30 sec x 3 rep bn, sidebending bilateral 5 sec x 5 rep bn ;Note additional exercise-.  Prone bilateral shoulder extension and mid trap x10 bn- to increase scapular muscle strength  to decrease cervical spine stress.             Modifications/Patient Education:  Min to moderate with correct form,  Verbal and tactile cueing- correct cervical angle with cervical flexibility exercises, to avoid lifting head up with chin tucks.        Manual Therapy: Myofascial release to increase muscle flexibility-C-PSM/T-PSM/ medial scap muscle/bilateral  Upper trap muscle/ Levator scap.    Modalities: Traction- Cervical Intermittent 15 lbs. for 15 min. Time: 30 seconds min. on and 10 seconds off.  Therapy Rationale: Increase Extensiblility      Current Measurements (ROM, Strength, Girth, Outcomes, etc.):          Assessment (response to treatment): 2.5/10 after RX        Progress towards functional goals:     Patient requires continued skilled care to:  Goals:   Date  (Body Area, Impairment Goal, Functional   Activity, Target Performance)  Time Frame  Status  Date/   Initial    03/21/2013  Increased deep neck flexor endurance - 30 seconds for patient able to sit 20 minutes with decreased pain 0-1/10  6 weeks      03/21/2013  Increase active cervical rotation to 10 degrees for increased visual field to allow safe changing lanes and backing up vehicle while driving.  6 weeks              Plan:  Continue with Plan of Care    [name], PT, DPT, [place] [address]  04/04/2013

## 2013-04-08 ENCOUNTER — Inpatient Hospital Stay: Payer: Enrolled Prime—HMO | Attending: Internal Medicine | Admitting: Rehabilitative and Restorative Service Providers"

## 2013-04-08 DIAGNOSIS — M542 Cervicalgia: Secondary | ICD-10-CM

## 2013-04-08 NOTE — PT/OT Therapy Note (Signed)
DAILY NOTE   04/08/2013     Time In/Out: 5:30 - 6:30 Total Time: 68' Visit Number: 5    # of Authorized Visits: 24 Visit #: 5    Diagnosis (Treating/Medical):     ICD-9-CM   1. Neck pain 723.1           Subjective:  Mike Scott's Upper back feeling pretty well. Neck still painful with movement  Functional Changes:     Objective:   Treatment:  Therapeutic Exercise:Per flowsheet to improve: Flexibility/ROM and Stabilization.- Chin tucks 10 sec x  10 rep bn; bilateral  Upper trap muscle/ Levator scap stretching- 30 sec x 3 rep bn, sidebending bilateral 5 sec x 5 rep bn ;  Prone bilateral shoulder extension and mid trap x10 1# bn.       Modifications/Patient Education:  Min to moderate with correct form,  Verbal and tactile cueing- eg correct UE position with scapular strengthening exercise for proper muscle activation.        Manual Therapy: Myofascial release to increase muscle flexibility-C-PSM/T-PSM/ medial scap muscle/bilateral  Upper trap muscle/ Levator scap.    Modalities: Traction- Cervical Intermittent 15 lbs. for 15 min. Time: 30 seconds min. on and 10 seconds off.  Therapy Rationale: Increase Extensiblility      Current Measurements (ROM, Strength, Girth, Outcomes, etc.):          Assessment (response to treatment): No complaint of increased sx after RX.        Progress towards functional goals:     Patient requires continued skilled care to:  Goals:   Date  (Body Area, Impairment Goal, Functional   Activity, Target Performance)  Time Frame  Status  Date/   Initial    03/21/2013  Increased deep neck flexor endurance - 30 seconds for patient able to sit 20 minutes with decreased pain 0-1/10  6 weeks      03/21/2013  Increase active cervical rotation to 10 degrees for increased visual field to allow safe changing lanes and backing up vehicle while driving.  6 weeks              Plan:  Continue with Plan of Care    [name], PT, DPT, [place] [address]  04/08/2013

## 2013-04-11 ENCOUNTER — Inpatient Hospital Stay: Payer: Enrolled Prime—HMO | Attending: Internal Medicine | Admitting: Rehabilitative and Restorative Service Providers"

## 2013-04-11 DIAGNOSIS — M542 Cervicalgia: Secondary | ICD-10-CM | POA: Insufficient documentation

## 2013-04-11 NOTE — PT/OT Therapy Note (Signed)
DAILY NOTE   04/11/2013     Time In/Out: 5:45 - 6:45 Total Time: 41' Visit Number: 6    # of Authorized Visits: 24 Visit #: 6    Diagnosis (Treating/Medical):     ICD-9-CM   1. Neck pain 723.1           Subjective:  Mike Scott's no new concerns expressed.  Functional Changes:     Objective:   Treatment:  Therapeutic Exercise:Per flowsheet to improve: Flexibility/ROM and Stabilization.- Chin tucks 10 sec x  10 rep bn; bilateral  Upper trap muscle/ Levator scap stretching- 30 sec x 3 rep bn, sidebending bilateral 5 sec x 5 rep bn ;  Prone bilateral shoulder extension and mid trap x10 1# bn.       Modifications/Patient Education:  Min to moderate with correct form,  Verbal and tactile cueing- eg correct UE position with scapular strengthening exercise for proper muscle activation.        Manual Therapy: Myofascial release to increase muscle flexibility-C-PSM/T-PSM/ medial scap muscle/bilateral  Upper trap muscle/ Levator scap.    Modalities: Traction- Cervical Intermittent 16 lbs. for 15 min. Time: 30 seconds min. on and 10 seconds off.  Therapy Rationale: Increase Extensiblility      Current Measurements (ROM, Strength, Girth, Outcomes, etc.):      R L R  (@ present )  L  (@ present )    Rotation 35 30 40 40        Assessment (response to treatment): No complaint of increased sx after RX.        Progress towards functional goals: Increased active cervical rotation  for meeting set goals with   increased visual field to allow safe changing lanes and backing up vehicle while driving.     Patient requires continued skilled care to:  Goals:   Date  (Body Area, Impairment Goal, Functional   Activity, Target Performance)  Time Frame  Status  Date/   Initial    03/21/2013  Increased deep neck flexor endurance - 30 seconds for patient able to sit 20 minutes with decreased pain 0-1/10  6 weeks      03/21/2013  Increase active cervical rotation to 10 degrees for increased visual field to allow safe changing lanes and backing up  vehicle while driving.  6 weeks  progressed 04/11/13/bn           Plan:  Continue with Plan of Care    [name], PT, DPT, [place] [address]  04/11/2013

## 2013-04-15 ENCOUNTER — Inpatient Hospital Stay: Payer: Enrolled Prime—HMO | Attending: Internal Medicine | Admitting: Rehabilitative and Restorative Service Providers"

## 2013-04-15 ENCOUNTER — Ambulatory Visit: Payer: Enrolled Prime—HMO | Admitting: Rehabilitative and Restorative Service Providers"

## 2013-04-15 DIAGNOSIS — M542 Cervicalgia: Secondary | ICD-10-CM | POA: Insufficient documentation

## 2013-04-15 NOTE — PT/OT Therapy Note (Signed)
DAILY NOTE   04/15/2013     Time In/Out: 4:45 - 5:45 Total Time: 37' Visit Number: 7    # of Authorized Visits: 24 Visit #: 7    Diagnosis (Treating/Medical):     ICD-9-CM   1. Neck pain 723.1           Subjective:  Mike Scott's c/o muscle soreness.  Functional Changes:     Objective:   Treatment:  Therapeutic Exercise:Per flowsheet to improve: Flexibility/ROM and Stabilization.- Chin tucks 10 sec x  10 rep bn; bilateral  Upper trap muscle/ Levator scap stretching- 30 sec x 3 rep bn, sidebending bilateral 5 sec x 5 rep bn ;  Prone bilateral shoulder extension and mid trap x10 2# bn.       Modifications/Patient Education:  Min  with correct form,  Verbal and tactile cueing.  Manual Therapy: Myofascial release to increase muscle flexibility-C-PSM/T-PSM/ medial scap muscle/bilateral  Upper trap muscle/ Levator scap.    Modalities: Traction- Cervical Intermittent 16 lbs. for 15 min. Time: 30 seconds min. on and 10 seconds off.  Therapy Rationale: Increase Extensiblility      Current Measurements (ROM, Strength, Girth, Outcomes, etc.):          Assessment (response to treatment): No complaint of increased sx after RX.        Progress towards functional goals:   Goals:   Date  (Body Area, Impairment Goal, Functional   Activity, Target Performance)  Time Frame  Status  Date/   Initial    03/21/2013  Increased deep neck flexor endurance - 30 seconds for patient able to sit 20 minutes with decreased pain 0-1/10  6 weeks      03/21/2013  Increase active cervical rotation to 10 degrees for increased visual field to allow safe changing lanes and backing up vehicle while driving.  6 weeks  progressed 04/11/13/bn           Plan:  Continue with Plan of Care    [name], PT, DPT, [place] [address]  04/15/2013

## 2013-04-16 NOTE — PT/OT Exercise Plan (Signed)
See visit notes.

## 2013-04-18 ENCOUNTER — Inpatient Hospital Stay: Payer: Enrolled Prime—HMO | Attending: Internal Medicine | Admitting: Rehabilitative and Restorative Service Providers"

## 2013-04-18 DIAGNOSIS — M542 Cervicalgia: Secondary | ICD-10-CM | POA: Insufficient documentation

## 2013-04-19 NOTE — PT/OT Therapy Note (Signed)
DAILY NOTE   04/19/2013     Time In/Out: 4:45 - 5:45 Total Time: 59' Visit Number: 8    # of Authorized Visits: 24 Visit #: 8    Diagnosis (Treating/Medical):     ICD-9-CM   1. Neck pain 723.1           Subjective:  Antrone's neck feeling better  Functional Changes:     Objective:   Treatment:  Therapeutic Exercise:Per flowsheet to improve: Flexibility/ROM and Stabilization.- Chin tucks 10 sec x  10 rep bn; bilateral  Upper trap muscle/ Levator scap stretching- 30 sec x 3 rep bn, sidebending bilateral 5 sec x 5 rep bn ;  Prone bilateral shoulder extension and mid trap x10 2# bn.       Modifications/Patient Education:  Min  with correct form,  Verbal and tactile cueing.eg correct UE position with scapular strengthening exercise for proper muscle activation.    Manual Therapy: Myofascial release to increase muscle flexibility-C-PSM/T-PSM/ medial scap muscle/bilateral  Upper trap muscle/ Levator scap.    Modalities: Traction- Cervical Intermittent 16 lbs. for 15 min. Time: 30 seconds min. on and 10 seconds off.  Therapy Rationale: Increase Extensiblility      Current Measurements (ROM, Strength, Girth, Outcomes, etc.):          Assessment (response to treatment): No complaint of increased sx after RX.        Progress towards functional goals:   Goals:   Date  (Body Area, Impairment Goal, Functional   Activity, Target Performance)  Time Frame  Status  Date/   Initial    03/21/2013  Increased deep neck flexor endurance - 30 seconds for patient able to sit 20 minutes with decreased pain 0-1/10  6 weeks      03/21/2013  Increase active cervical rotation to 10 degrees for increased visual field to allow safe changing lanes and backing up vehicle while driving.  6 weeks  progressed 04/11/13/bn           Plan:  Continue with Plan of Care    [name], PT, DPT, [place] [address]  04/19/2013

## 2013-04-22 ENCOUNTER — Ambulatory Visit: Payer: Enrolled Prime—HMO | Admitting: Rehabilitative and Restorative Service Providers"

## 2013-04-25 ENCOUNTER — Inpatient Hospital Stay: Payer: Enrolled Prime—HMO | Attending: Internal Medicine | Admitting: Rehabilitative and Restorative Service Providers"

## 2013-04-25 DIAGNOSIS — M542 Cervicalgia: Secondary | ICD-10-CM | POA: Insufficient documentation

## 2013-04-25 NOTE — PT/OT Therapy Note (Signed)
DAILY NOTE   04/25/2013     Time In/Out: 4:45 - 5:50 Total Time: 41' Visit Number: 9    # of Authorized Visits: 24 Visit #: 1    Diagnosis (Treating/Medical):     ICD-9-CM   1. Neck pain 723.1           Subjective:  [place]xton's no new concerns expressed.    Functional Changes:     Objective:   Treatment:  Therapeutic Exercise:Per flowsheet to improve: Flexibility/ROM and Stabilization.- Chin tucks 10 sec x  10 rep bn; bilateral  Upper trap muscle/ Levator scap stretching- 30 sec x 3 rep bn, sidebending bilateral 5 sec x 5 rep bn ;  Prone bilateral shoulder extension and mid trap x10 2# bn.       Modifications/Patient Education:  Min  with correct form,  Verbal and tactile cueing.    Manual Therapy: Myofascial release to increase muscle flexibility-C-PSM/T-PSM/ medial scap muscle/bilateral  Upper trap muscle/ Levator scap. Dry needling of bilateral UT and Teres minor muscle for trigger point release    Modalities: Traction- Cervical Intermittent 16 lbs. for 15 min. Time: 30 seconds min. on and 10 seconds off.  Therapy Rationale: Increase Extensiblility      Current Measurements (ROM, Strength, Girth, Outcomes, etc.):          Assessment (response to treatment):  c/o some muscle soreness after Rx.        Progress towards functional goals:   Goals:   Date  (Body Area, Impairment Goal, Functional   Activity, Target Performance)  Time Frame  Status  Date/   Initial    03/21/2013  Increased deep neck flexor endurance - 30 seconds for patient able to sit 20 minutes with decreased pain 0-1/10  6 weeks      03/21/2013  Increase active cervical rotation to 10 degrees for increased visual field to allow safe changing lanes and backing up vehicle while driving.  6 weeks  progressed 04/11/13/bn           Plan:  Continue with Plan of Care    [name], PT, DPT, [place] [address]  04/25/2013

## 2013-04-25 NOTE — PT/OT Exercise Plan (Signed)
See visit notes.

## 2013-05-09 ENCOUNTER — Inpatient Hospital Stay: Payer: Enrolled Prime—HMO | Attending: Internal Medicine | Admitting: Rehabilitative and Restorative Service Providers"

## 2013-05-09 DIAGNOSIS — M542 Cervicalgia: Secondary | ICD-10-CM | POA: Insufficient documentation

## 2013-05-09 NOTE — PT/OT Exercise Plan (Signed)
See visit notes.

## 2013-05-09 NOTE — PT/OT Therapy Note (Signed)
DAILY NOTE   05/09/2013     Time In/Out: 6:45 - 5:50 Total Time: 77' Visit Number: 10    # of Authorized Visits: 24 Visit #: 2    Diagnosis (Treating/Medical):     ICD-9-CM   1. Neck pain 723.1           Subjective:  Nareg's dry needling was helpful-last visit  Functional Changes:     Objective:   Treatment:  Therapeutic Exercise:Per flowsheet to improve: Flexibility/ROM and Stabilization.- Chin tucks 10 sec x  10 rep bn; bilateral  Upper trap muscle/ Levator scap stretching- 30 sec x 3 rep bn, sidebending bilateral 5 sec x 5 rep bn ;  Prone bilateral shoulder extension and mid trap x10 2# bn.       Modifications/Patient Education:  Min  with correct form,  Verbal and tactile cueing.    Manual Therapy: Myofascial release to increase muscle flexibility-C-PSM//bilateral  Upper trap muscle/ Levator scap. Dry needling of bilateral UT and sub occipital muscle for trigger point release    Modalities: Traction- Cervical Intermittent 16 lbs. for 15 min. Time: 30 seconds min. on and 10 seconds off.  Therapy Rationale: Increase Extensiblility      Current Measurements (ROM, Strength, Girth, Outcomes, etc.):          Assessment (response to treatment): Muscle twitches produced by dry needling of bilateral   Upper trap muscle      Progress towards functional goals:   Goals:   Date  (Body Area, Impairment Goal, Functional   Activity, Target Performance)  Time Frame  Status  Date/   Initial    03/21/2013  Increased deep neck flexor endurance - 30 seconds for patient able to sit 20 minutes with decreased pain 0-1/10  6 weeks      03/21/2013  Increase active cervical rotation to 10 degrees for increased visual field to allow safe changing lanes and backing up vehicle while driving.  6 weeks  progressed 04/11/13/bn           Plan:  Continue with Plan of Care    [name], PT, DPT, [place] [address]  05/09/2013

## 2013-05-14 ENCOUNTER — Inpatient Hospital Stay: Payer: Enrolled Prime—HMO | Attending: Internal Medicine | Admitting: Rehabilitative and Restorative Service Providers"

## 2013-05-14 DIAGNOSIS — M542 Cervicalgia: Secondary | ICD-10-CM | POA: Insufficient documentation

## 2013-05-14 NOTE — PT/OT Therapy Note (Signed)
DAILY NOTE   05/14/2013     Time In/Out: 5:00 - 6:00 Total Time: 59' Visit Number: 11    # of Authorized Visits: 24 Visit #: 3    Diagnosis (Treating/Medical):     ICD-9-CM   1. Neck pain 723.1           Subjective:  Dvonte's dry needling helping. Neck pain doing pretty well.  Functional Changes:     Objective:   Treatment:  Therapeutic Exercise:Per flowsheet to improve: Flexibility/ROM and Stabilization.- Chin tucks 10 sec x  10 rep bn; bilateral  Upper trap muscle/ Levator scap stretching- 30 sec x 3 rep bn, sidebending bilateral 5 sec x 5 rep bn ;  Prone bilateral shoulder extension and mid trap x10 2# bn.       Modifications/Patient Education:  Min  with correct form,  Verbal and tactile cueing.    Manual Therapy: Myofascial release to increase muscle flexibility-C-PSM//bilateral  Upper trap muscle/ Levator scap. Dry needling of bilateral UT and sub occipital muscle for trigger point release    Modalities: Traction- Cervical Intermittent 16 lbs. for 15 min. Time: 30 seconds min. on and 10 seconds off.  Therapy Rationale: Increase Extensiblility      Current Measurements (ROM, Strength, Girth, Outcomes, etc.):          Assessment (response to treatment): Muscle twitches produced by dry needling of bilateral   Upper trap muscle      Progress towards functional goals:   Goals:   Date  (Body Area, Impairment Goal, Functional   Activity, Target Performance)  Time Frame  Status  Date/   Initial    03/21/2013  Increased deep neck flexor endurance - 30 seconds for patient able to sit 20 minutes with decreased pain 0-1/10  6 weeks      03/21/2013  Increase active cervical rotation to 10 degrees for increased visual field to allow safe changing lanes and backing up vehicle while driving.  6 weeks  progressed 04/11/13/bn           Plan:  Continue with Plan of Care next visit possible d/c    [name], PT, DPT, [place] [address]  05/14/2013

## 2013-05-14 NOTE — PT/OT Exercise Plan (Signed)
See visit note

## 2013-06-07 ENCOUNTER — Inpatient Hospital Stay: Payer: Enrolled Prime—HMO | Attending: Internal Medicine | Admitting: Rehabilitative and Restorative Service Providers"

## 2013-06-07 DIAGNOSIS — M542 Cervicalgia: Secondary | ICD-10-CM | POA: Insufficient documentation

## 2013-06-07 NOTE — PT/OT Therapy Note (Signed)
DAILY NOTE   06/07/2013     Time In/Out: 4:15 - 5:20 Total Time: 60' Visit Number: 12    # of Authorized Visits: 24 Visit #: 4    Diagnosis (Treating/Medical):     ICD-9-CM   1. Neck pain 723.1           Subjective:  Keshun's  Been [place] of [place] on vacation. Not much chance to do HEP.  Functional Changes:     Objective:   Treatment:  Therapeutic Exercise:Per flowsheet to improve: Flexibility/ROM and Stabilization.- Chin tucks 10 sec x  10 rep bn; bilateral  Upper trap muscle/ Levator scap stretching- 30 sec x 3 rep bn, sidebending bilateral 5 sec x 5 rep bn ;  Prone bilateral shoulder extension and mid trap x20 2# bn.       Modifications/Patient Education:  Min  with correct form,  Verbal and tactile cueing.  Emphasized compliance with HEP.      Manual Therapy: Myofascial release to increase muscle flexibility-C-PSM//bilateral  Upper trap muscle/ Levator scap. Dry needling of bilateral UT and sub occipital muscle for trigger point release    Modalities: Traction- Cervical Intermittent 16 lbs. for 15 min. Time: 30 seconds min. on and 10 seconds off.  Therapy Rationale: Increase Extensiblility      Current Measurements (ROM, Strength, Girth, Outcomes, etc.):    R L R  (@ present )  L  (@ present )    Rotation 35 30 46 43       deep neck flexor endurance- 45 seconds (@IE )- 22 seconds       Assessment (response to treatment): Muscle twitches produced by dry needling of bilateral   Upper trap muscle      Progress towards functional goals:  NDI 18% (@IE -24%)  Patient benefited from skilled care to with decreased cervical/ radicular pain, increased cervical AROM, increased deep neck flexor endurance test and functional goals.     Goals:   Date  (Body Area, Impairment Goal, Functional   Activity, Target Performance)  Time Frame  Status  Date/   Initial    03/21/2013  Increased deep neck flexor endurance - 30 seconds for patient able to sit 20 minutes with decreased pain 0-1/10  6 weeks  met 06/07/13/bn   03/21/2013   Increase active cervical rotation to 10 degrees for increased visual field to allow safe changing lanes and backing up vehicle while driving.  6 weeks  met 06/07/13/bn           Plan:  Discharged from P.T./O.T. Goals Met.     [name], PT, DPT, [place] [address]  06/07/2013

## 2020-05-12 IMAGING — DX FOOT 2 VIEWS LEFT
1 series · 2 of 2 positions shown · non-contrast
Comparison: None.

FOOT 2 VIEWS LEFT, 05/12/2020 [DATE]: 
CLINICAL INDICATION:  Patient heard popping noise after stopping to change 
direction. Attention lateral border. Trauma occurred one day prior.

[Series 1: AP · U · 0.14mm/px · 2 of 2 slices shown]
[im 1/2]
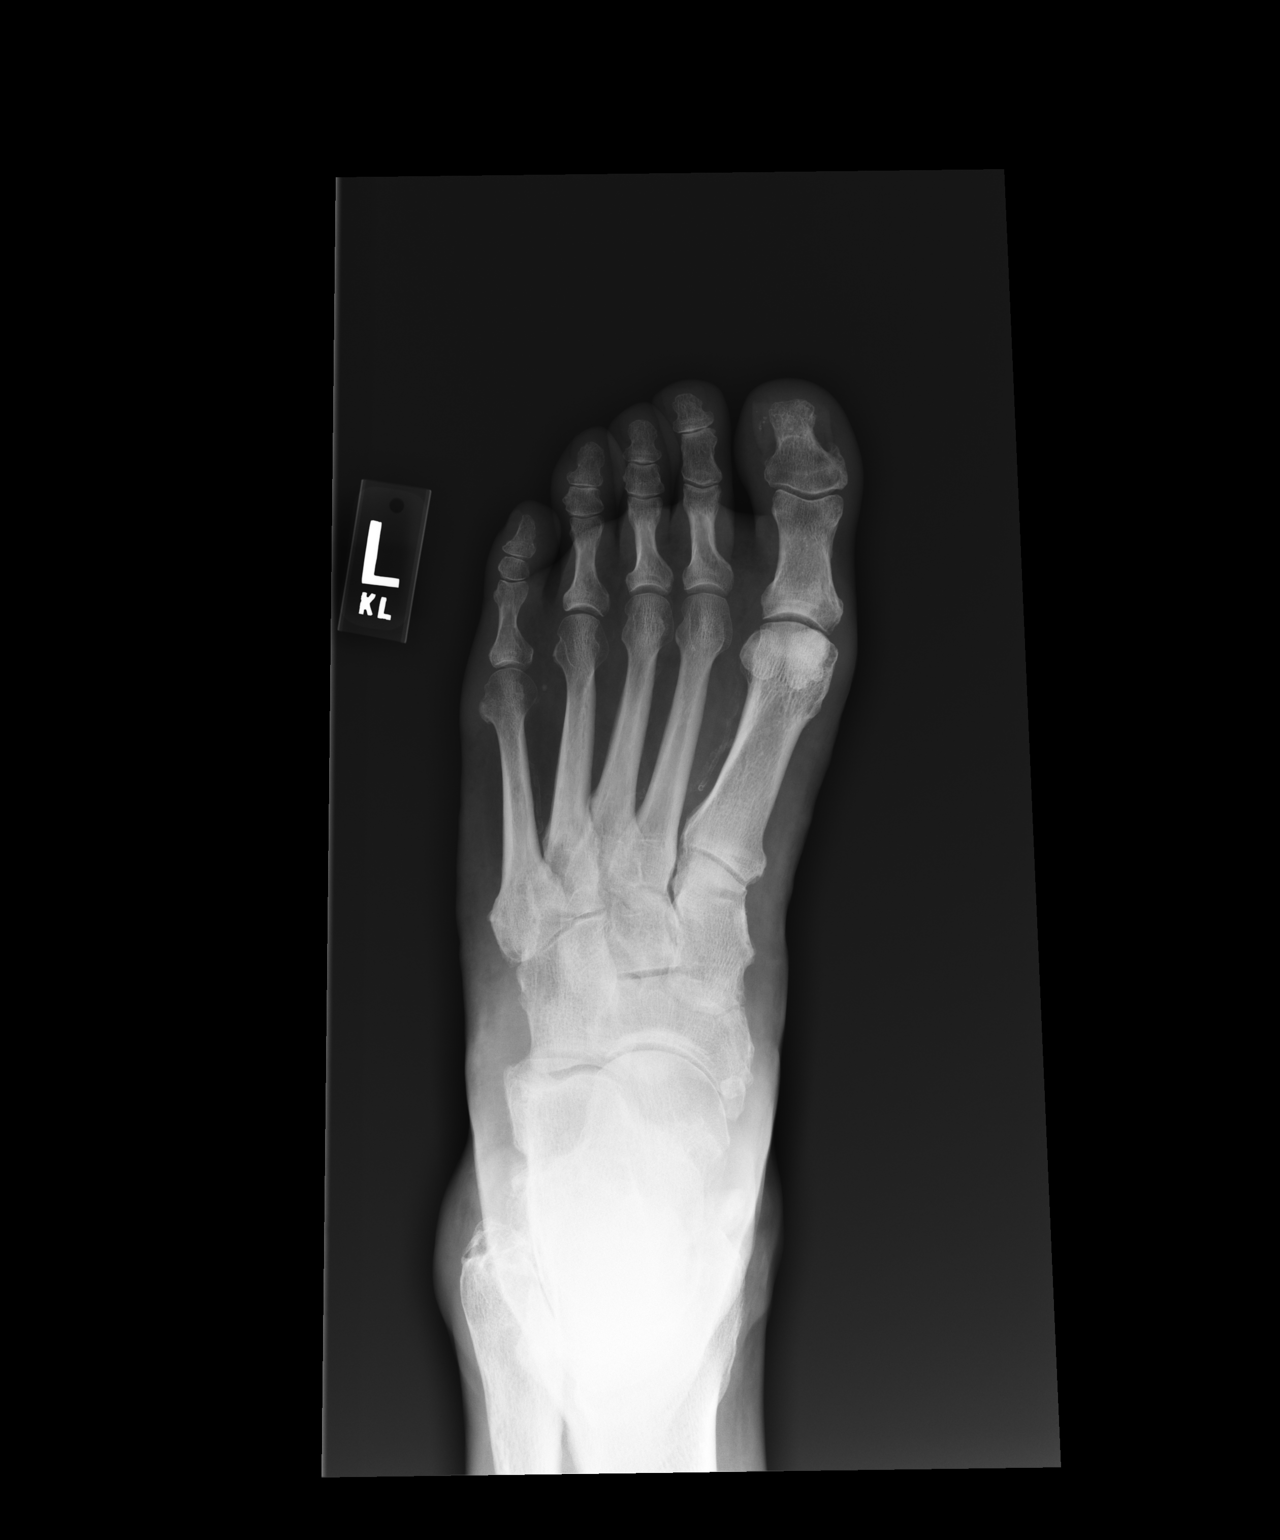
[im 2/2]
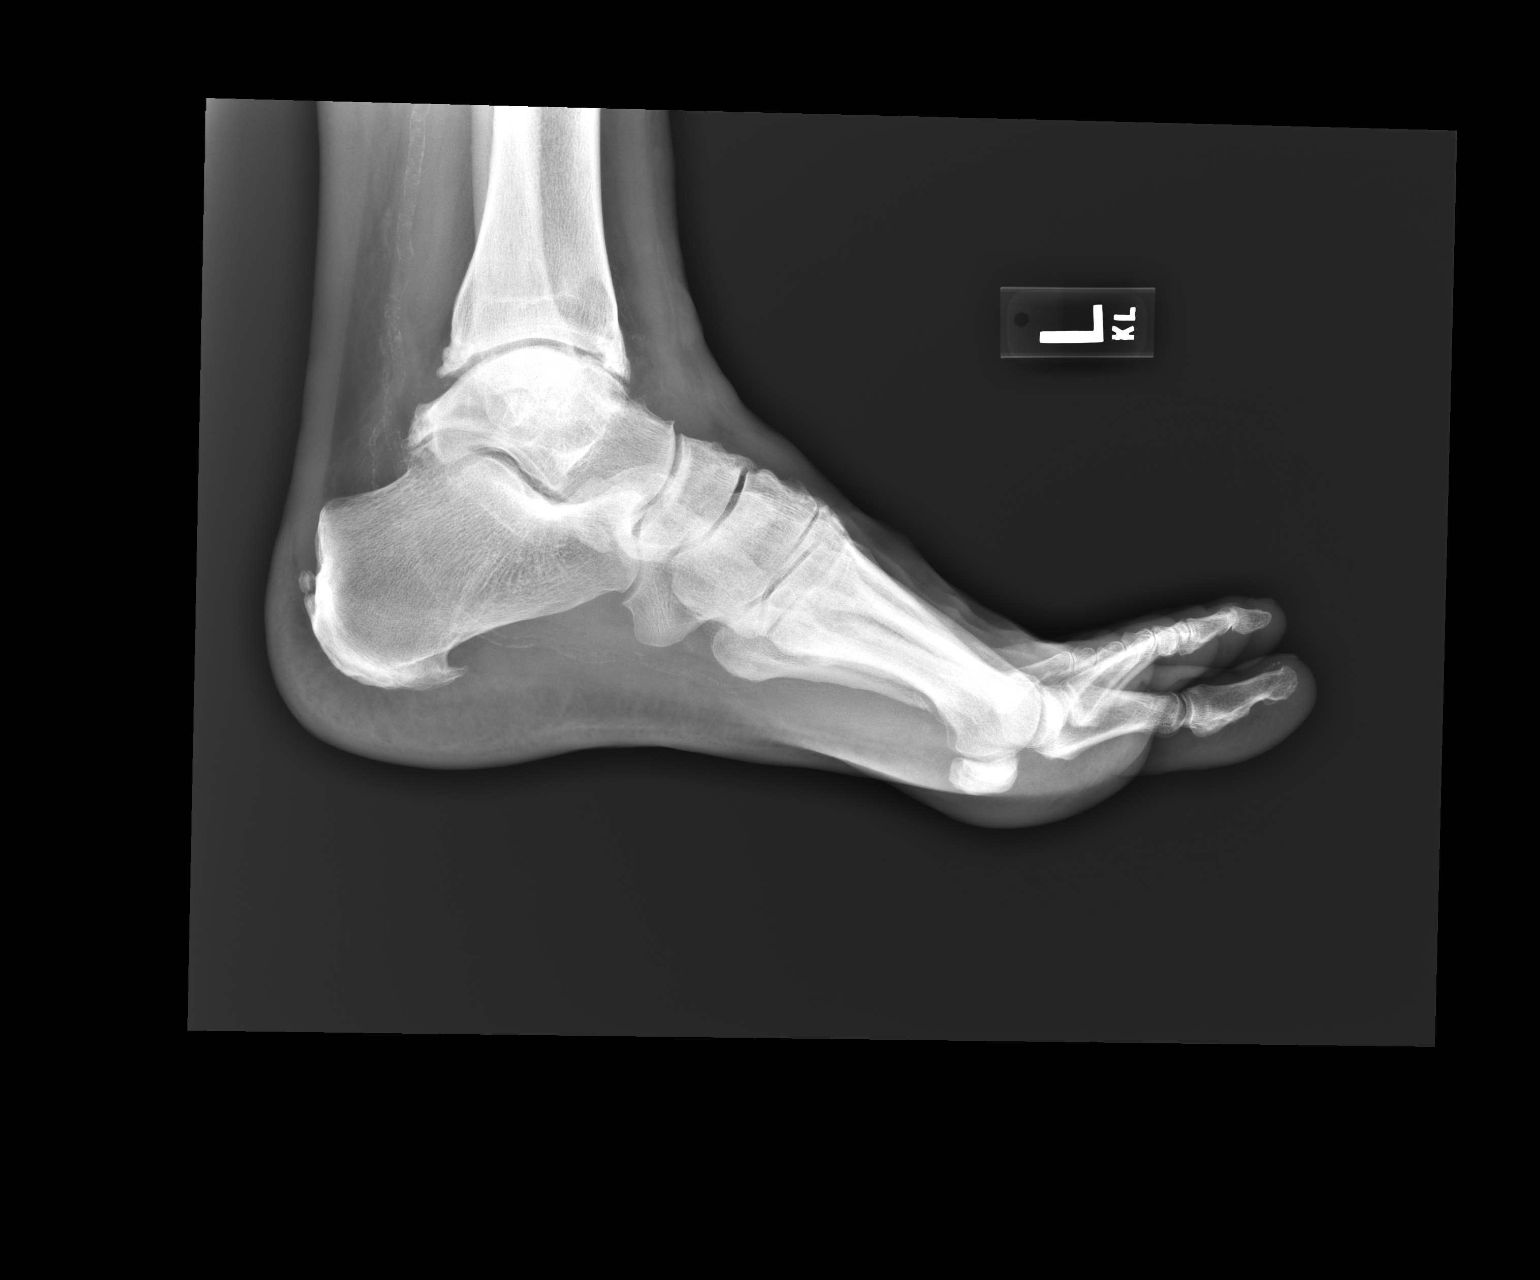

[2 of 2 positions shown; findings below may reference images not displayed]

FINDINGS: No fracture. Normal alignment. Scattered joint space narrowing with 
greatest involvement of the tibiotalar and subtalar level with osseous 
remodeling osteophytic spurring. Prominent Achilles and plantar calcaneal spurs. 
Type II accessory navicular bone. Mild scattered vascular calcifications.
IMPRESSION: 1.  No acute osseous abnormality. 
2.  Scattered degenerative changes.

## 2021-04-16 IMAGING — DX LUMBAR SPINE COMPLETE 4 VIEWS
1 series · 4 of 4 positions shown · non-contrast
Comparison: None

________________________________________________________________________________________________ 
LUMBAR SPINE COMPLETE 4 VIEWS, 04/16/2021 [DATE]: 
CLINICAL INDICATION: Pain.

[Series 1: AP · U · 0.14mm/px · 4 of 4 slices shown]
[im 1/4]
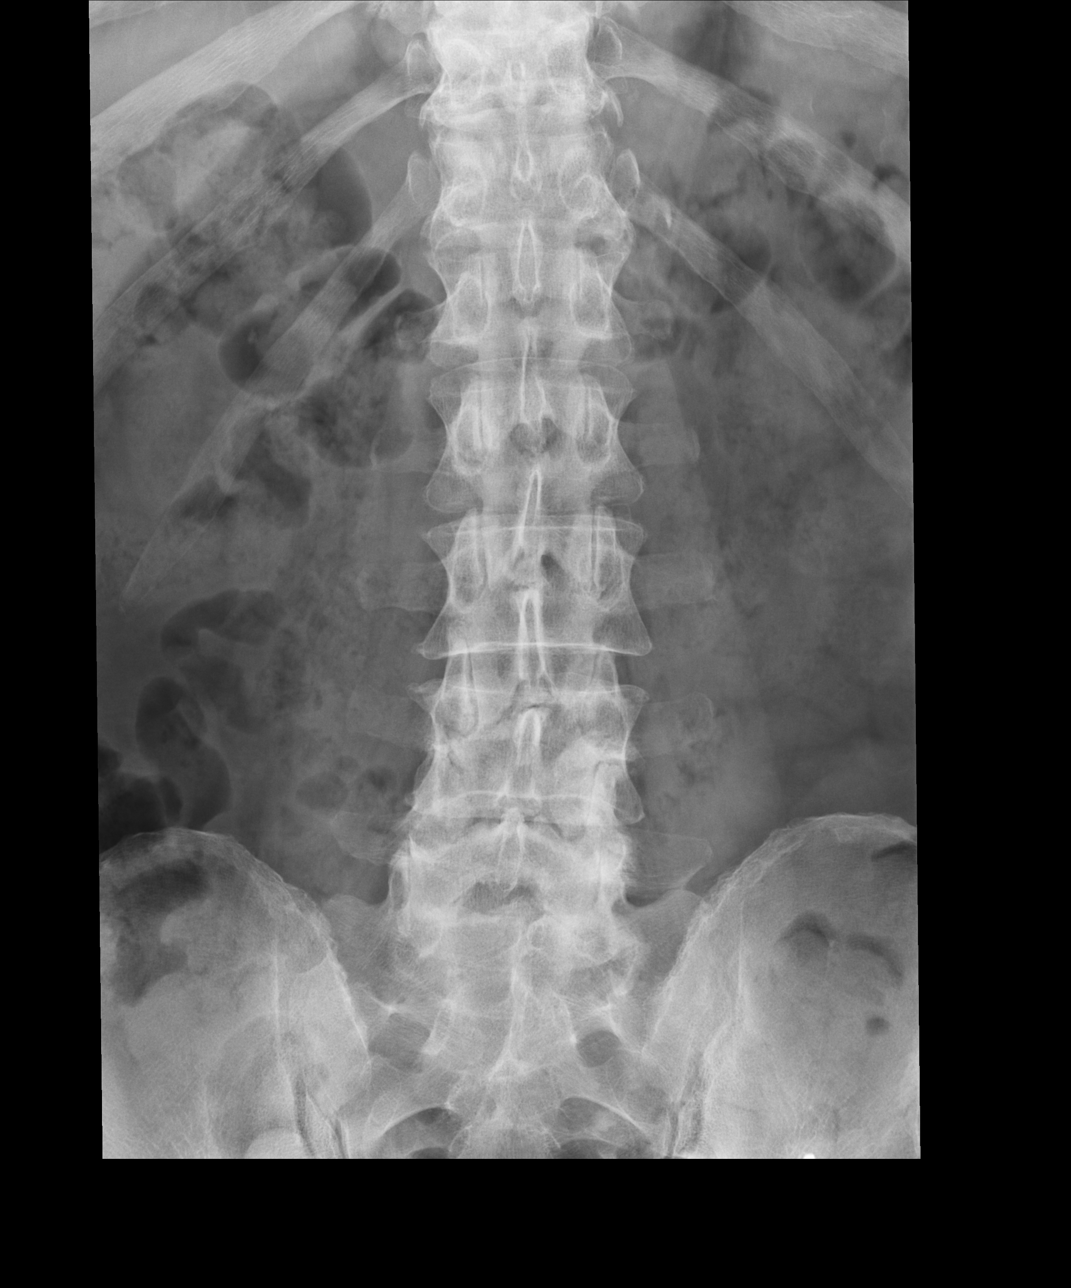
[im 2/4]
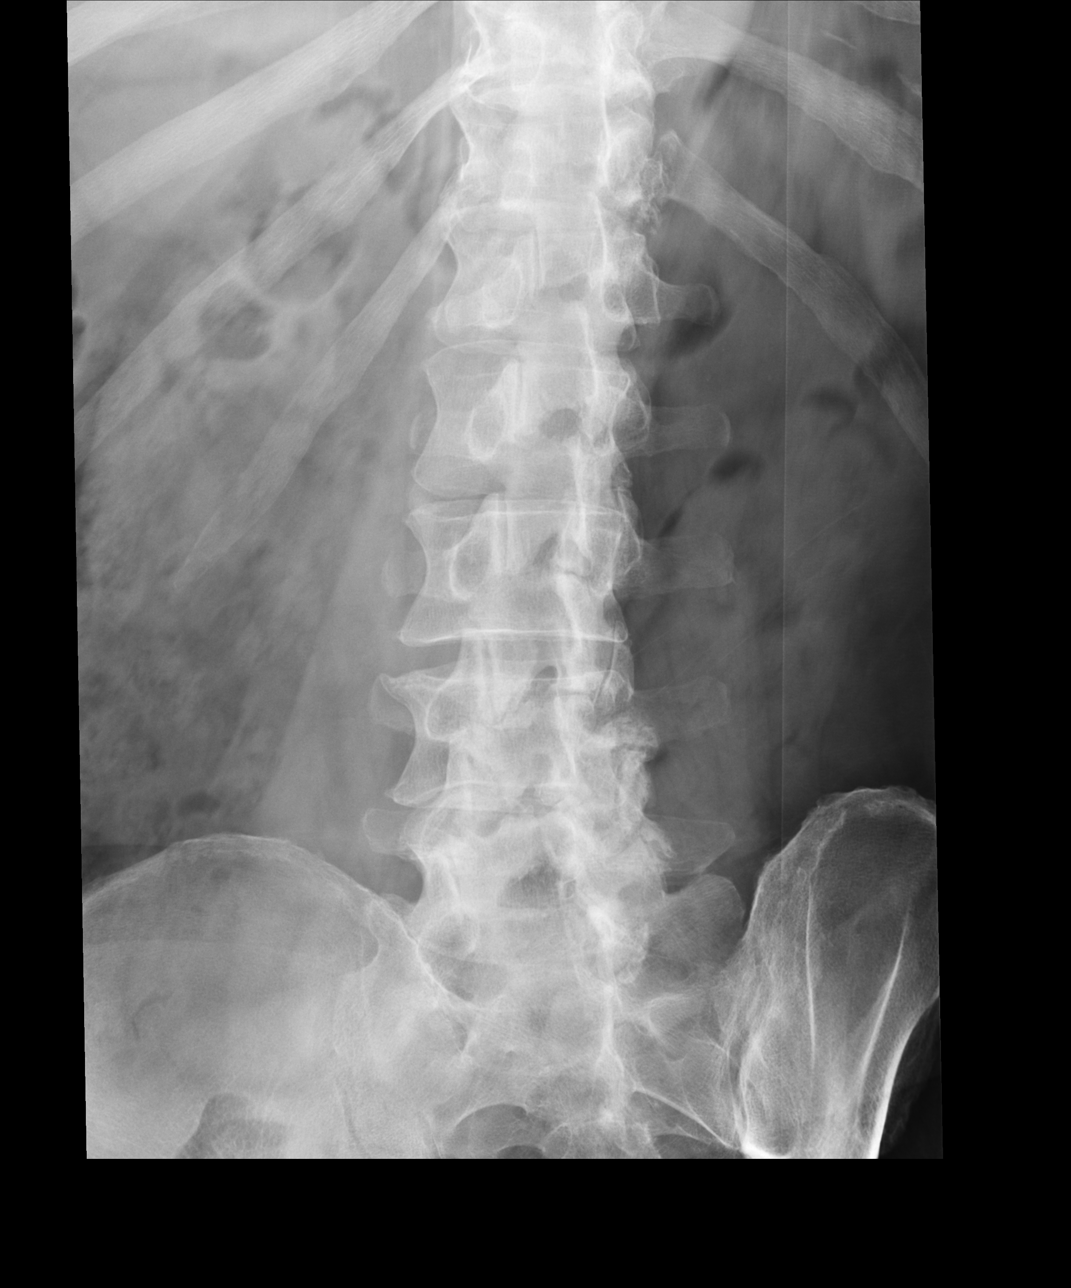
[im 3/4]
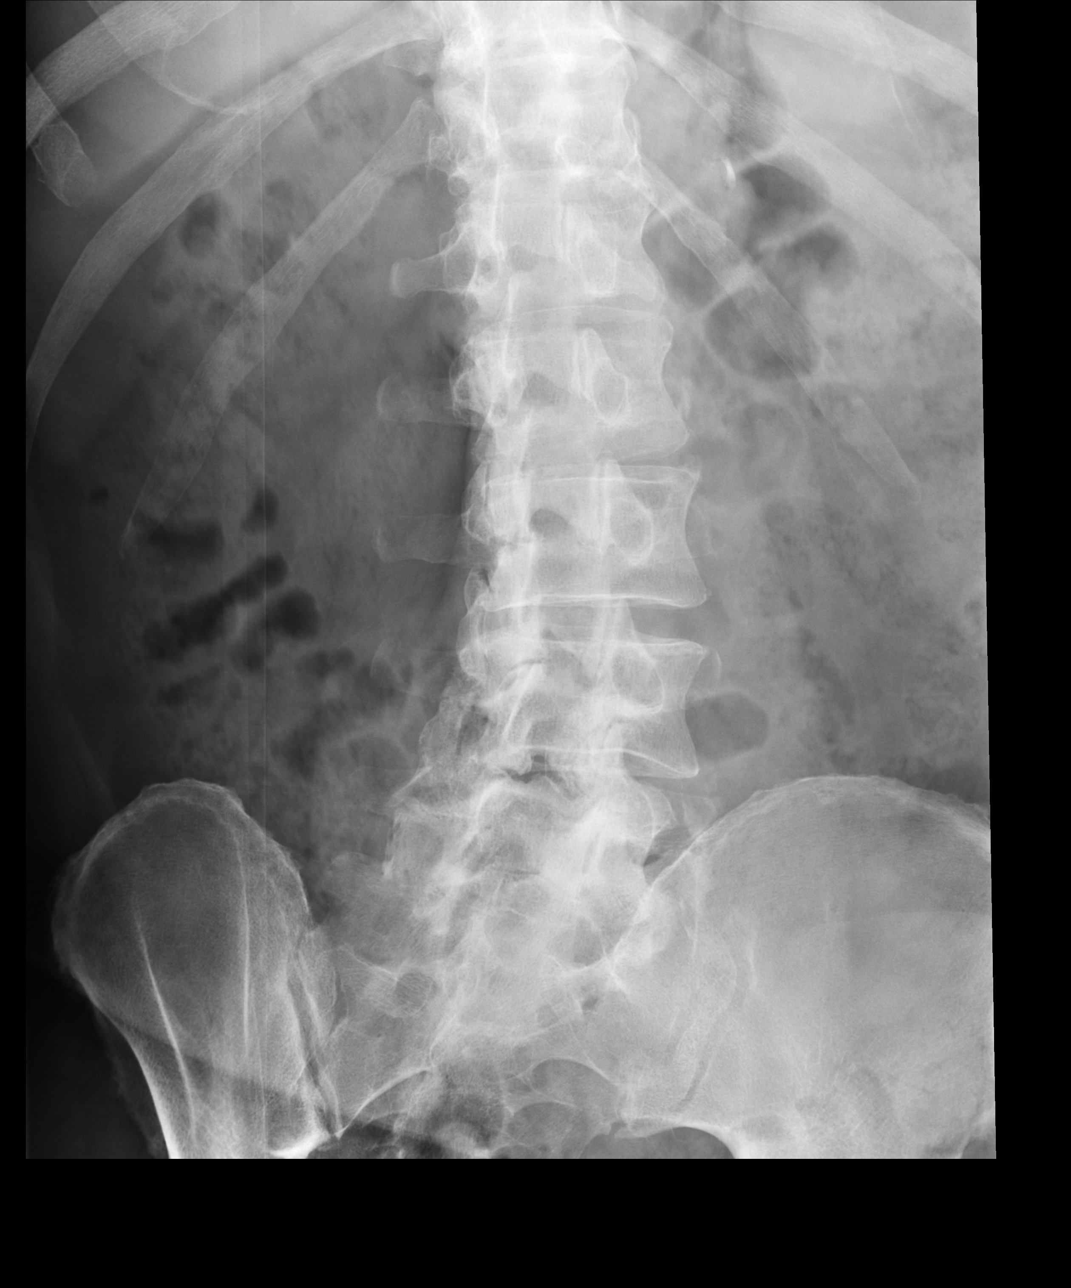
[im 4/4]
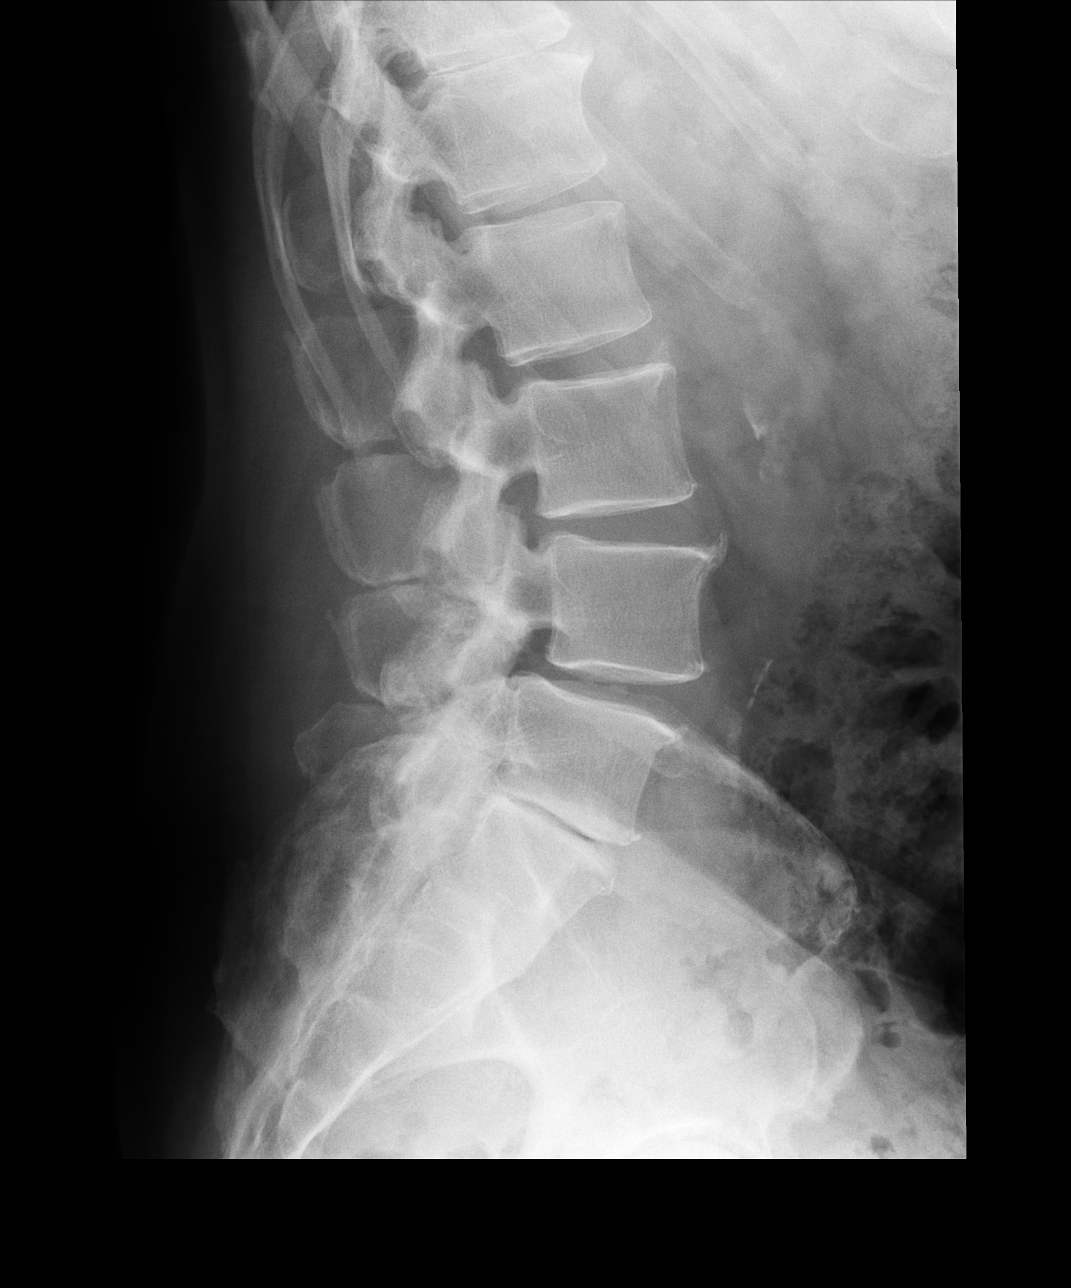

[4 of 4 positions shown; findings below may reference images not displayed]

FINDINGS: No fracture. Mild anterolisthesis L4 on L5. Scattered disc height 
loss. Mild osteophytic spurring. There is lower lumbar facet hypertrophy. Mild 
degenerative changes SI joints. Scattered vascular calcifications.
IMPRESSION: Moderately advanced spondylotic changes. If symptoms persist, recommend MR exam.

## 2021-05-04 IMAGING — MR MRI LUMBAR SPINE WITHOUT CONTRAST
4 of 6 series · 14 of 48 positions shown · IV contrast (gadolinium)
Comparison: X-ray lumbar spine from April 16, 2021.

________________________________________________________________________________________________ 
MRI LUMBAR SPINE WITHOUT CONTRAST, 05/04/2021 [DATE]: 
CLINICAL INDICATION: RADICULAR BACK PAIN.Numbness down legs into the feet. 
Sharp, dull, achy pains.
TECHNIQUE: Multiplanar, multiecho position MR images of the lumbar spine were 
performed without intravenous gadolinium enhancement. Patient was scanned on a 
3T magnet.

[Series 101: survey · axial · 10.0mm · 1.39mm/px · z∈[-15,+199]mm · 4 of 9 slices shown]
[im 1/9]
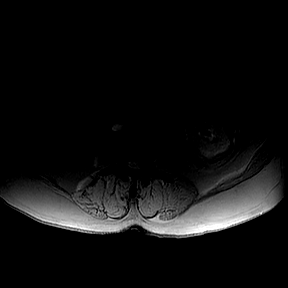
[im 3/9]
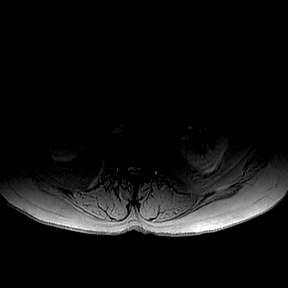
[im 6/9]
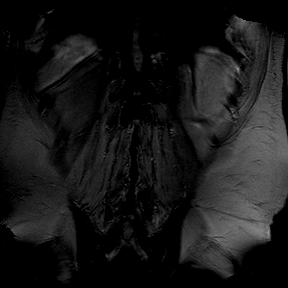
[im 9/9]
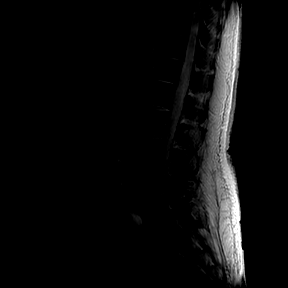

[Series 201: t2w_cor-surv · coronal · 6.0mm · 0.50mm/px · 2 of 5 slices shown]
[im 1/5]
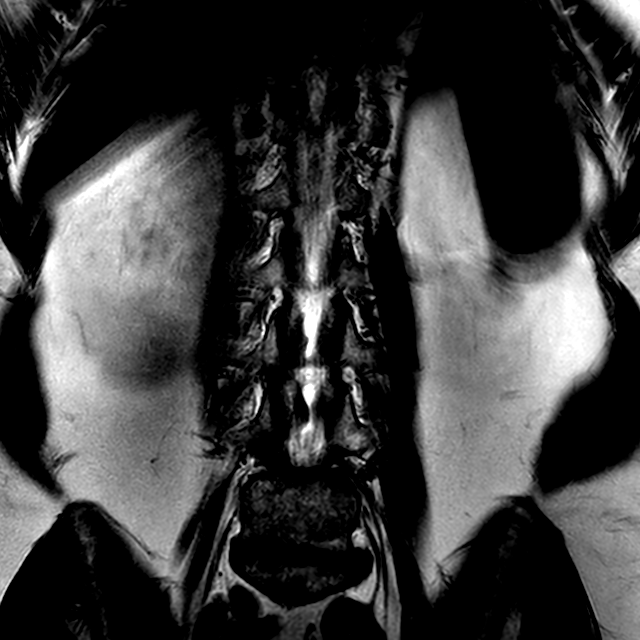
[im 5/5]
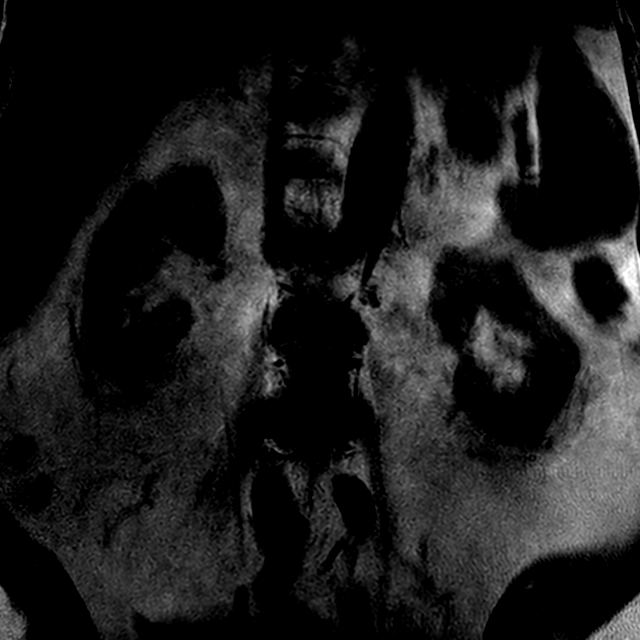

[Series 301: t2w_tse sag · sagittal · 4.0mm · 0.35mm/px · 5 of 18 slices shown]
[im 1/18]
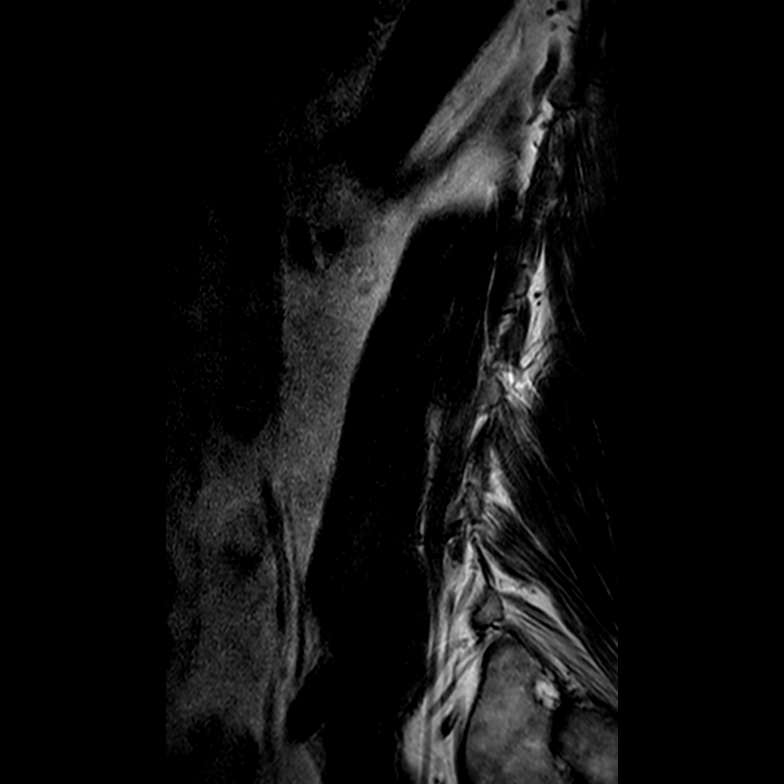
[im 3/18]
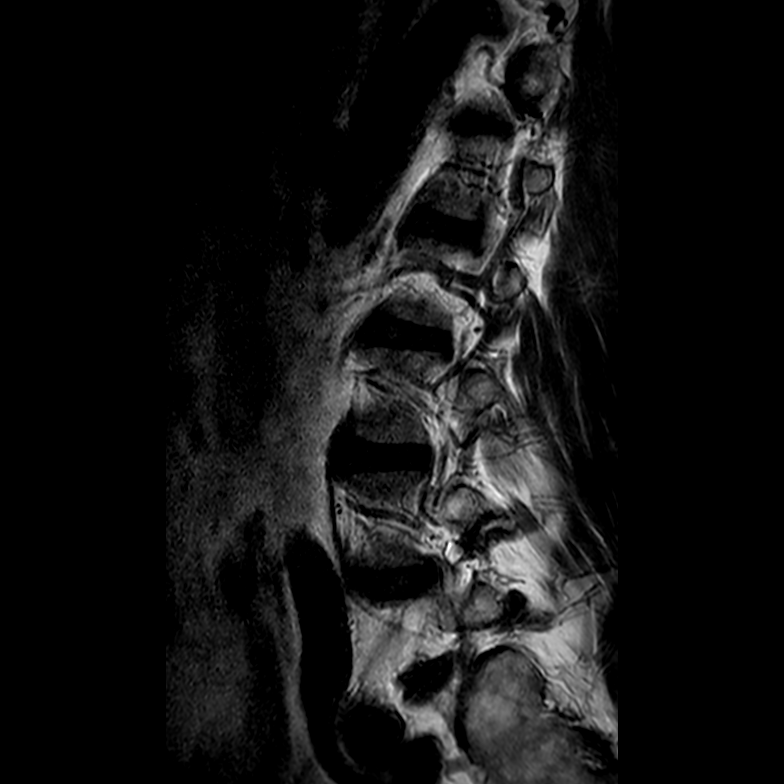
[im 5/18]
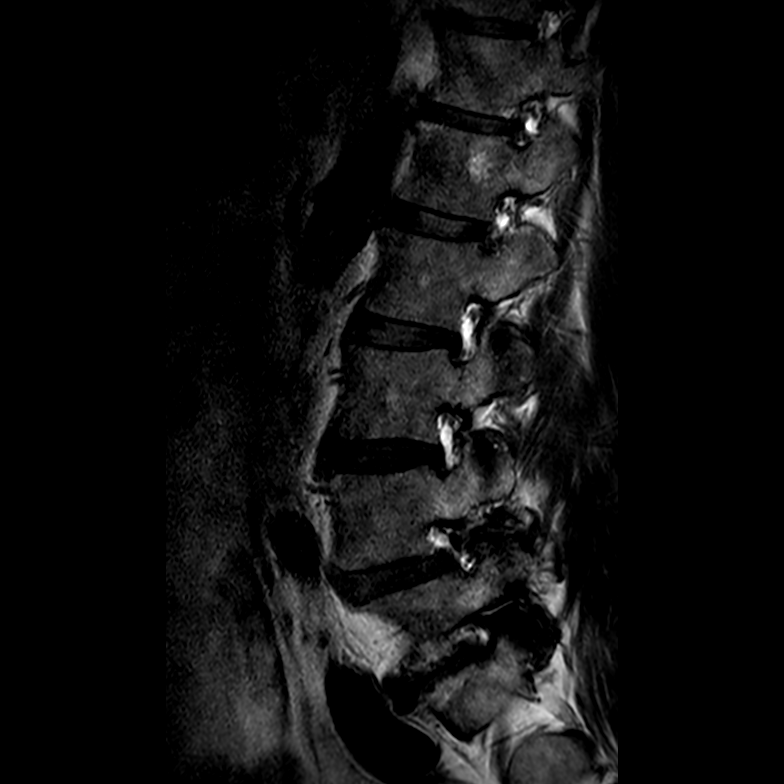
[im 9/18]
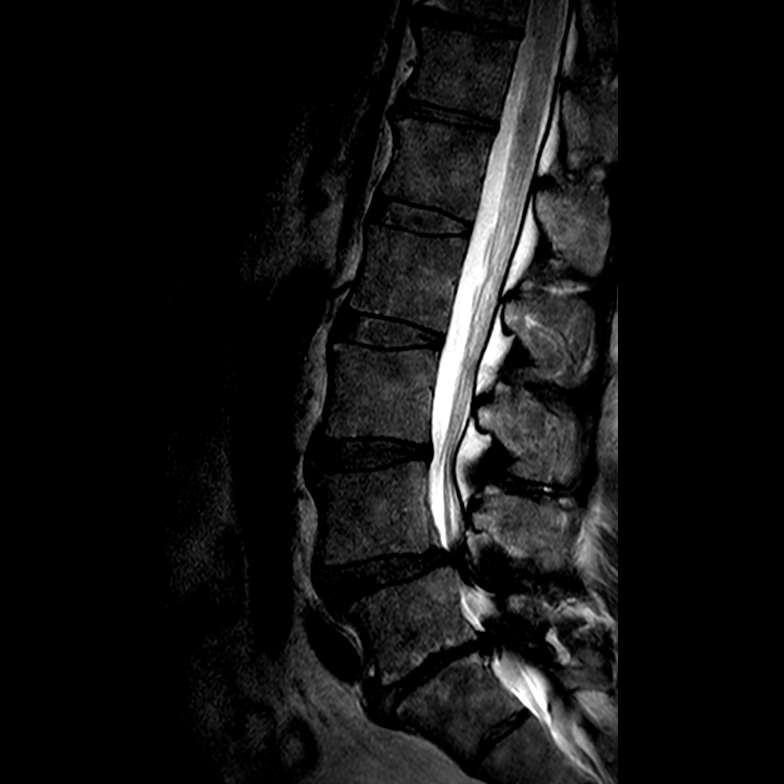
[im 15/18]
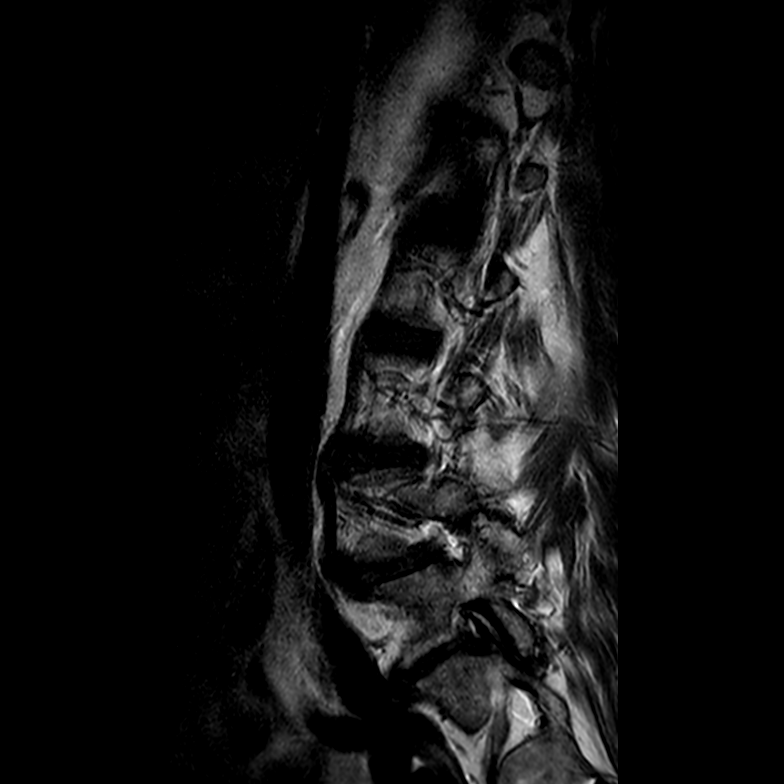

[Series 401: t1w_tse sag · sagittal · 4.0mm · 0.54mm/px · 3 of 18 slices shown]
[im 3/18]
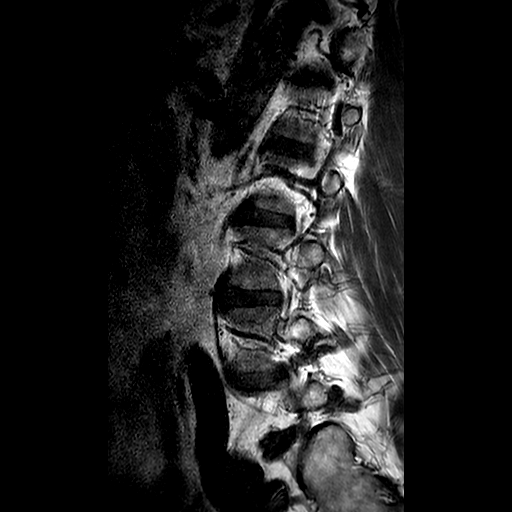
[im 9/18]
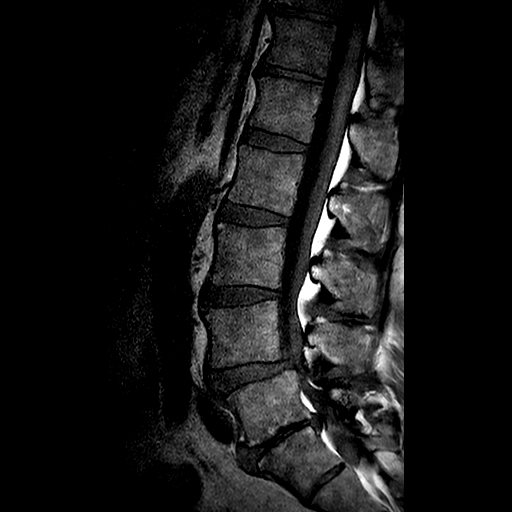
[im 15/18]
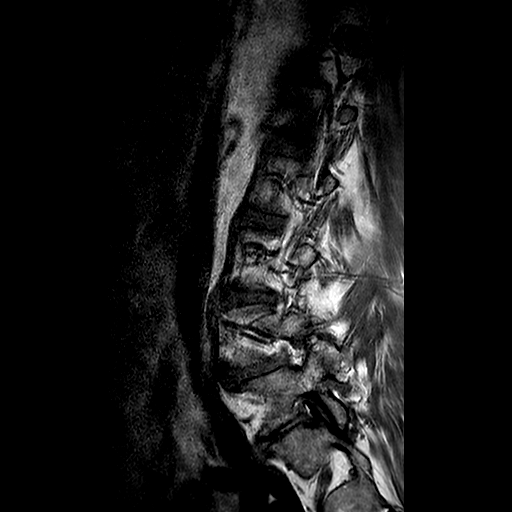

[14 of 48 positions shown; findings below may reference images not displayed]

FINDINGS: -------------------------------------------------------------------------------- 
------ 
GENERAL: 
Nomenclature is based on 5 lumbar type vertebral bodies.     
ALIGNMENT: Grade 1 anterolisthesis of L4 relative to L5. 
VERTEBRAL BODY HEIGHT: Normal.  
MARROW SIGNAL: No focal suspect signal abnormality. 
CORD SIGNAL: Normal distal spinal cord and cauda equina. Conus medullaris 
terminates at L1. 
ADDITIONAL FINDINGS: None. 
Modic I-II: L5-S1 
Ligamentum Flavum > 2.5 mm: All levels. 
-------------------------------------------------------------------------------- 
------ 
SEGMENTAL: 
T12-L1: Mild loss of disc height. No significant central canal narrowing.  No 
significant right neural foraminal narrowing. No significant left neural 
foraminal narrowing.  
L1-L2: No significant central canal narrowing.  No significant right neural 
foraminal narrowing. No significant left neural foraminal narrowing.  
L2-L3: No significant central canal narrowing.  No significant right neural 
foraminal narrowing. No significant left neural foraminal narrowing.  
L3-L4: Disc bulge with bilateral facet hypertrophy. Right ligamentum flavum 
hypertrophy. Mild right sided central canal narrowing. Mild right subarticular 
recess narrowing.  No significant right neural foraminal narrowing. No 
significant left neural foraminal narrowing.  
L4-L5: Uncovering of the disc space. Extensive bilateral facet and ligamentum 
flavum hypertrophy. Severe central canal and bilateral subarticular recess 
narrowing.  Moderate right neural foraminal narrowing. Mild left neural 
foraminal narrowing.  
L5-S1: Loss of disc height with disc bulge. Bilateral facet hypertrophy. Mild 
central canal narrowing with moderate bilateral subarticular recess narrowing.  
Severe right neural foraminal narrowing. Severe left neural foraminal narrowing. 

-------------------------------------------------------------------------------- 
------
IMPRESSION: 1.  Discogenic/degenerative changes as above. 
2.  Worst level(s) of central canal/subarticular recess narrowing: L4-L5 
(severe) 
3.  Worst level(s) of neural foraminal narrowing: L5-S1 (severe bilateral)

## 2021-07-08 IMAGING — DX LUMBAR SPINE 2 VIEW BENDING VIEWS ONLY
1 series · 3 of 3 positions shown · non-contrast
Comparison: Lumbar radiograph April 16, 2021

________________________________________________________________________________________________ 
LUMBAR SPINE 2 VIEW, 07/08/2021 [DATE]: 
CLINICAL INDICATION:  Bilateral lower extremity numbness, stenosis

[Series 1: extension · 0.14mm/px · 3 of 3 slices shown]
[im 1/3]
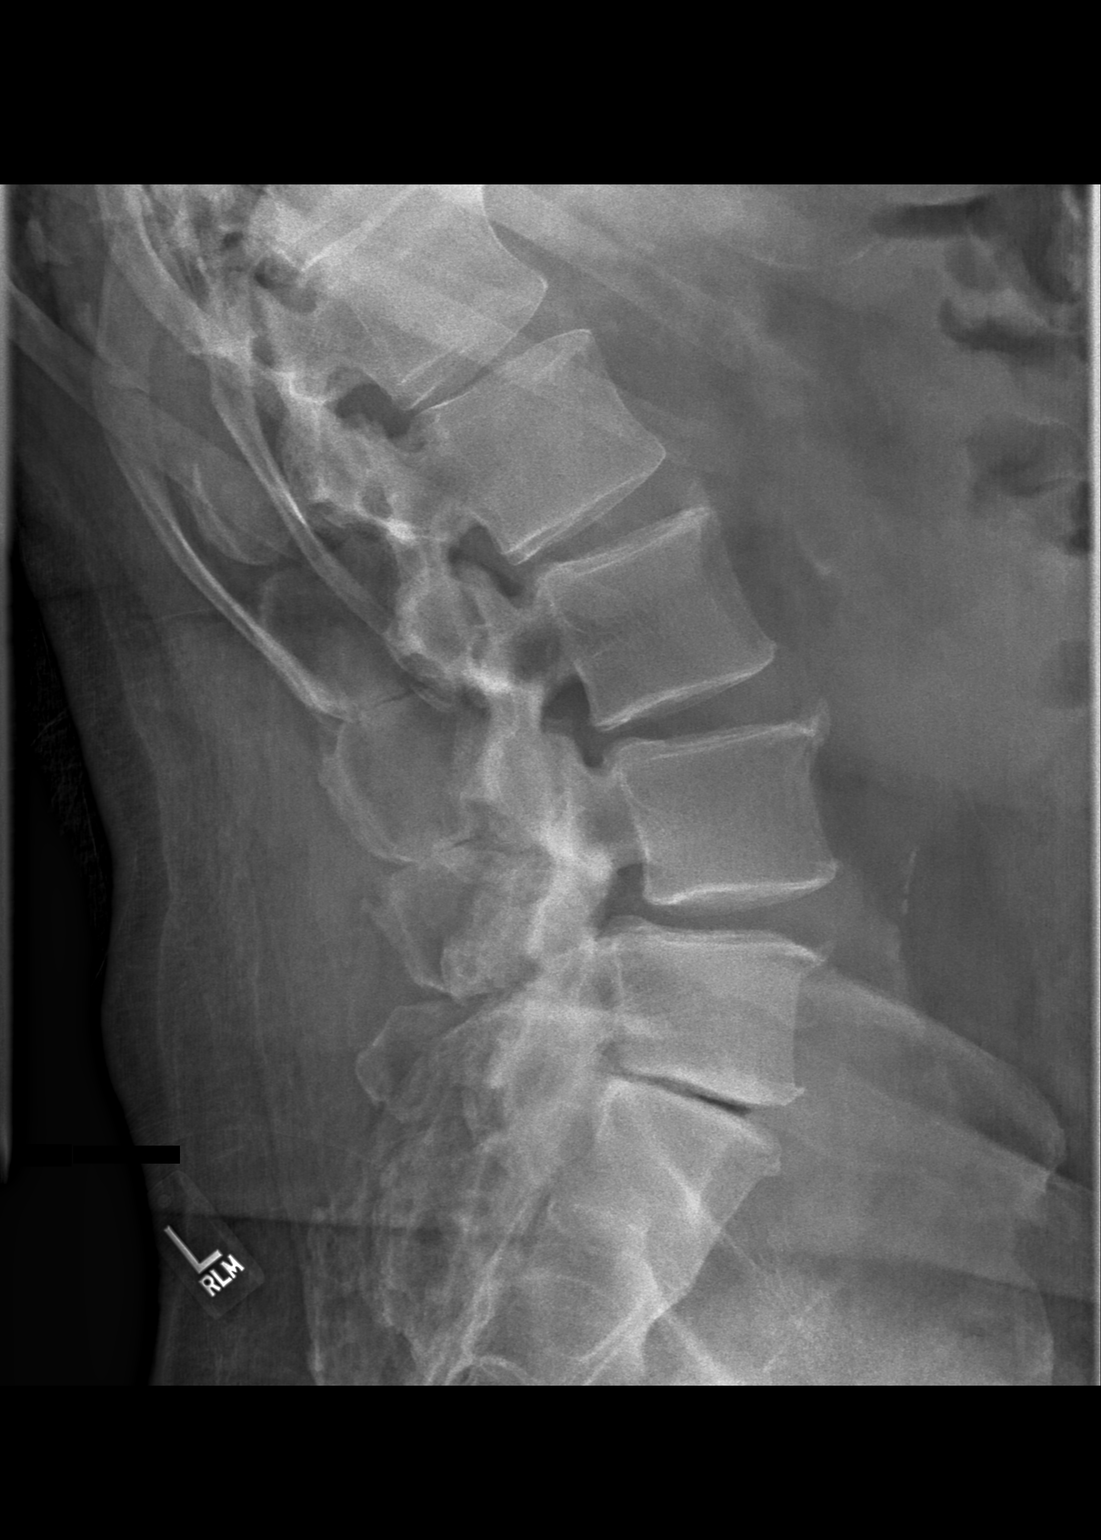
[im 2/3]
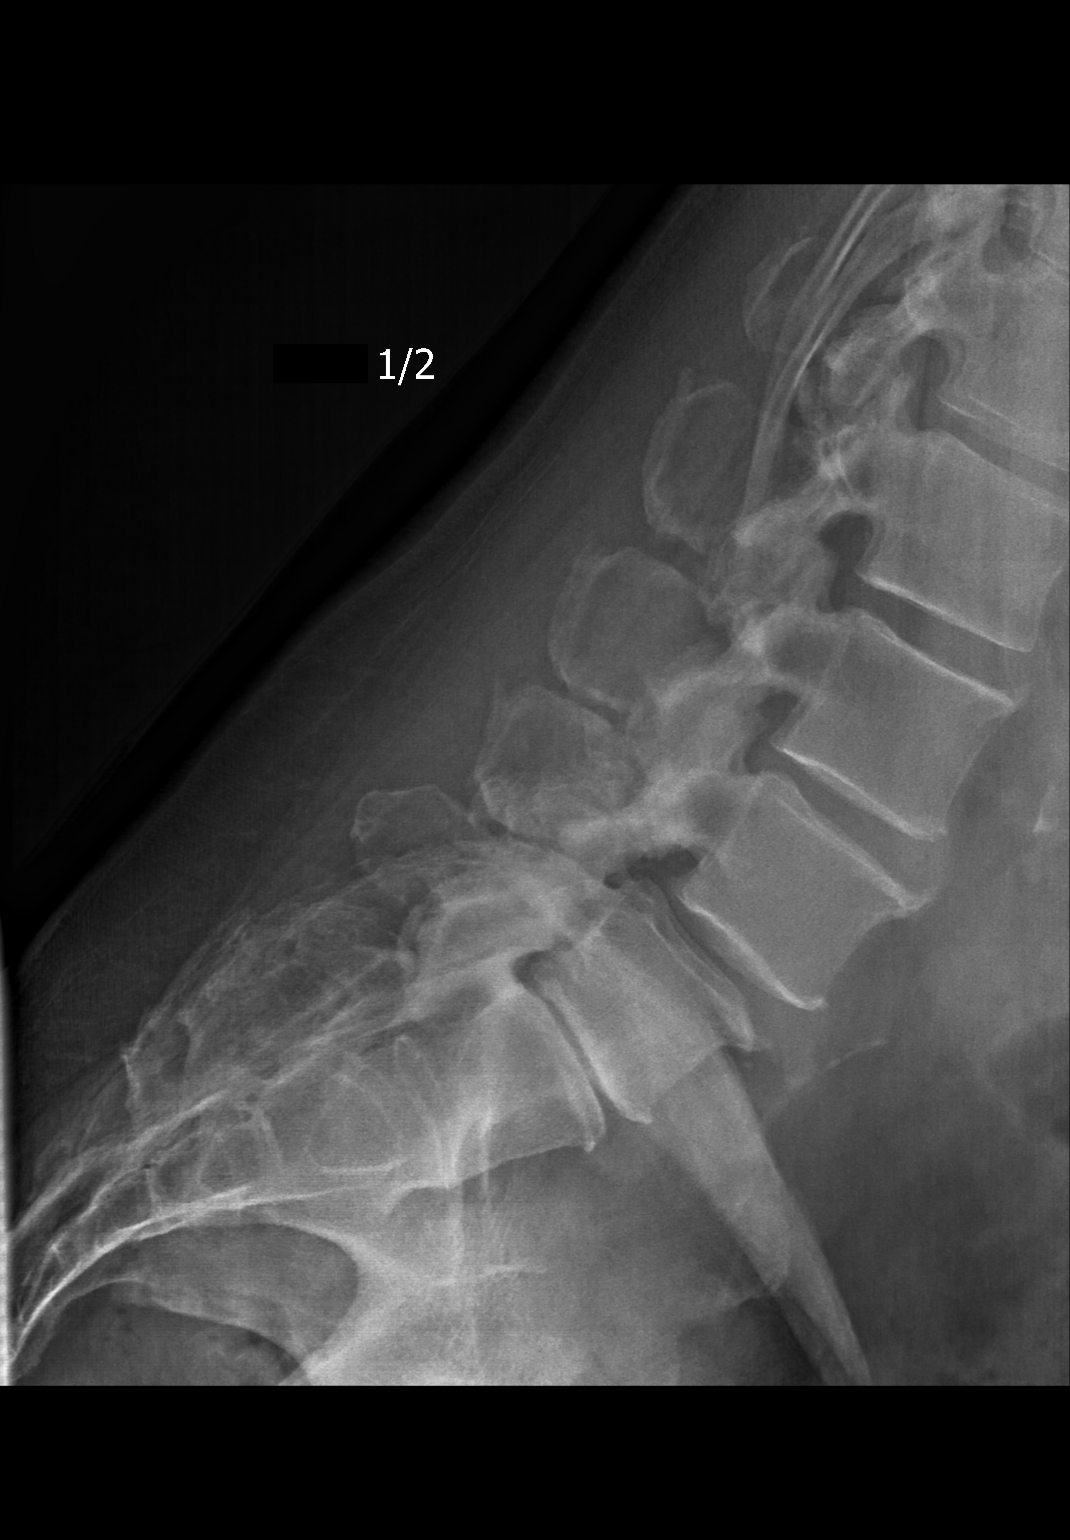
[im 3/3]
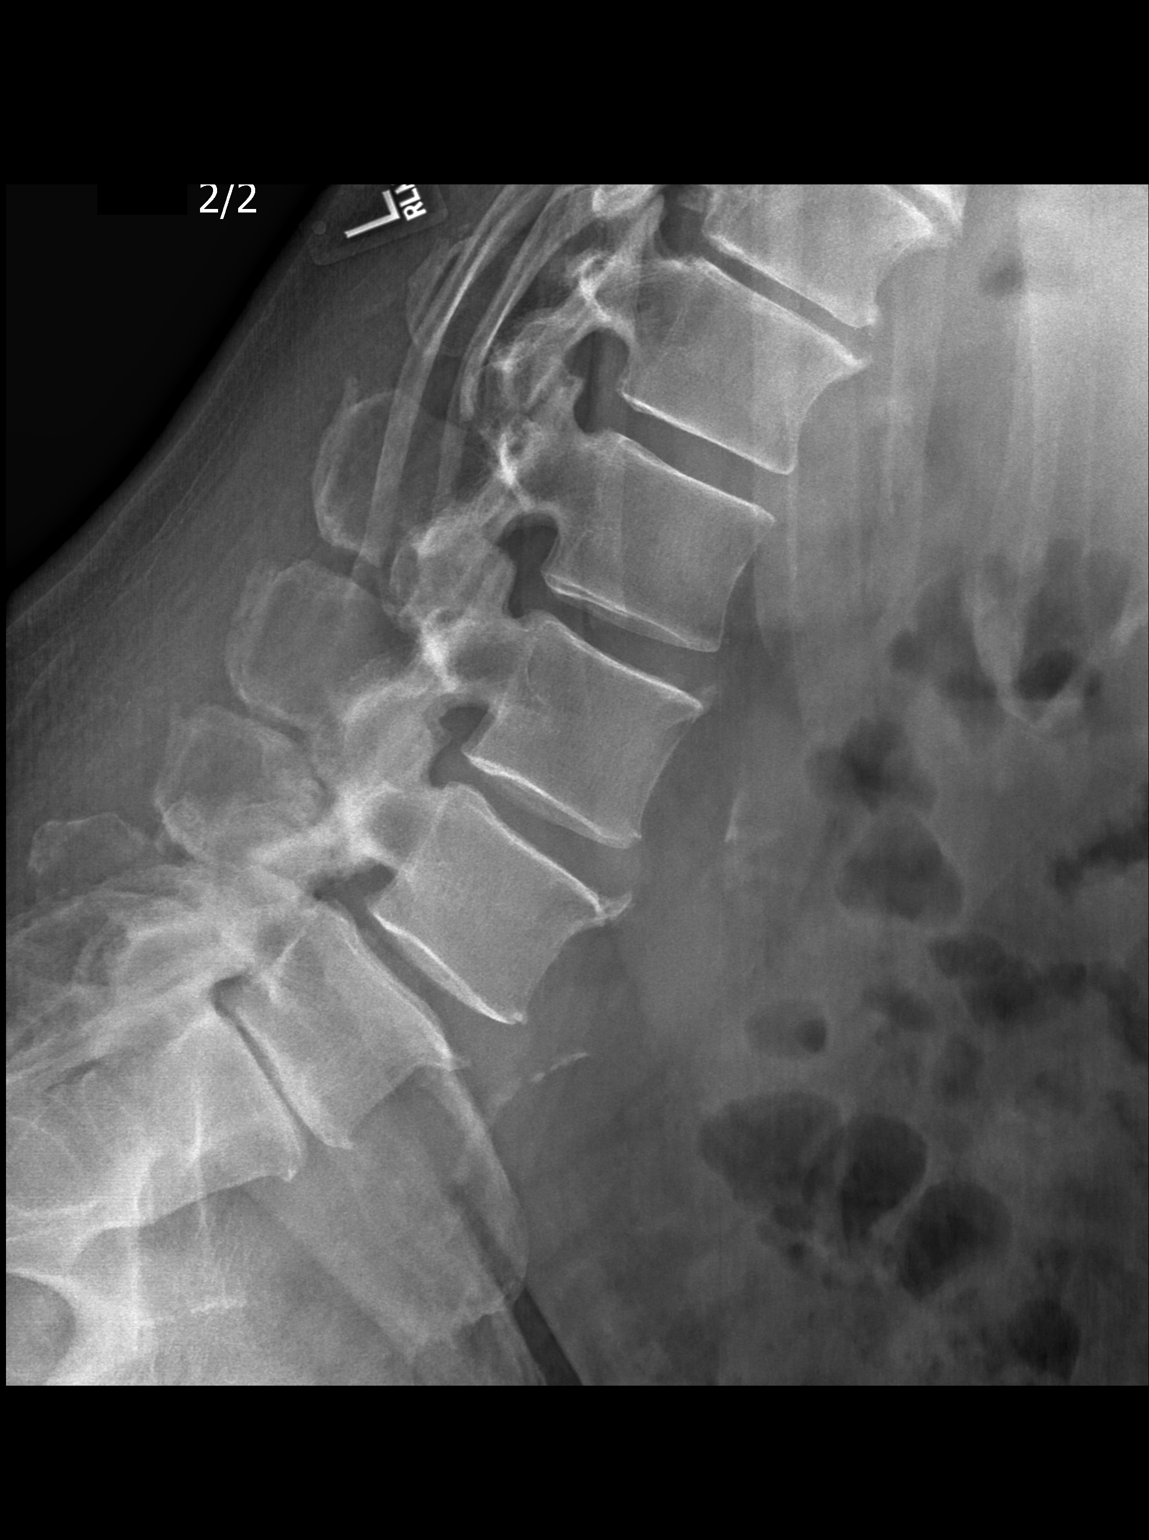

[3 of 3 positions shown; findings below may reference images not displayed]

FINDINGS: Less than grade 1 anterolisthesis at L4-5 is not different from the 
prior study. Flexion-extension views show no significant dynamic subluxation. 
There is slight retrolisthesis at L3-4 seen on flexion and extension views, not 
present on the neutral lateral or prior study. Marked disc narrowing at L5-S1 
with vacuum phenomenon is unchanged. There are facet degenerative changes, most 
pronounced at the lower 2 levels. .
IMPRESSION: Degenerative changes as described. Less than grade 1 anterolisthesis at L4-5 
does not show a dynamic subluxation.

## 2021-07-08 IMAGING — CT CT LUMBAR SPINE WITHOUT CONTRAST
3 series · 12 of 33 positions shown, 14 images · non-contrast
Comparison: Lumbar MRI May 04, 2021

________________________________________________________________________________________________ 
CT LUMBAR SPINE WITHOUT CONTRAST, 07/08/2021 [DATE]: 
CLINICAL INDICATION: Spinal stenosis, spondylosis with radiculopathy, bilateral 
leg numbness 
A search for DICOM formatted images was conducted for prior CT imaging studies 
completed at a non-affiliated media free facility.
TECHNIQUE: The lumbar spine was scanned from T12 through mid-sacrum without 
contrast on a high-resolution CT scanner using dose reduction techniques. 
Routing MPR reconstructions were performed. 3-D renderings were reconstructed on 
an independent workstation with concurrent physician supervision.

[Series 3: l spine 2.0 b31s · axial · 0.38mm/px · z∈[-366,-140]mm · 4 of 163 slices shown, 5 images]
[im 25/163  soft-tissue]
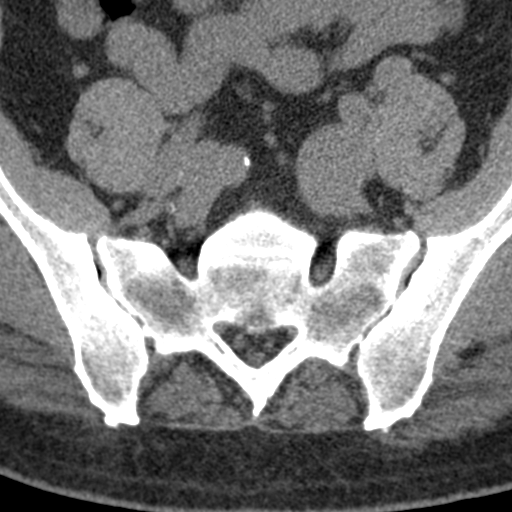
[im 25/163  bone]
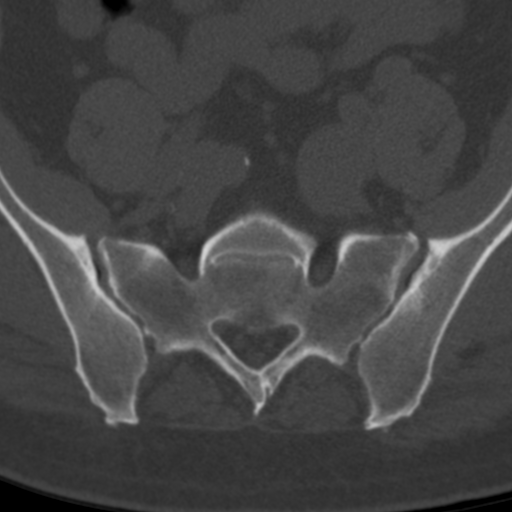
[im 63/163  bone]
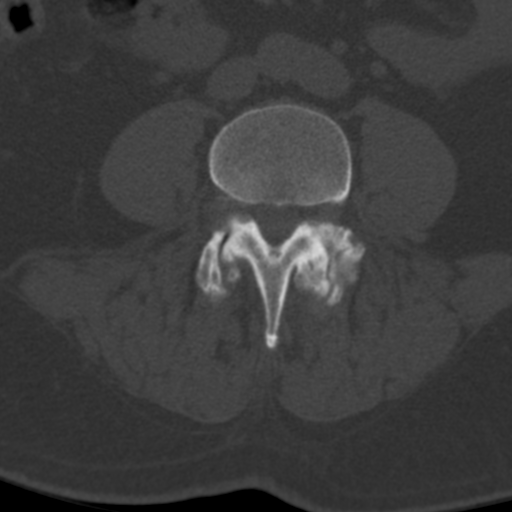
[im 100/163  bone]
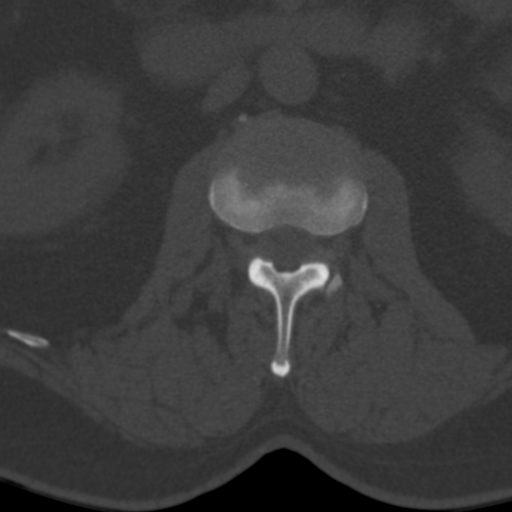
[im 138/163  bone]
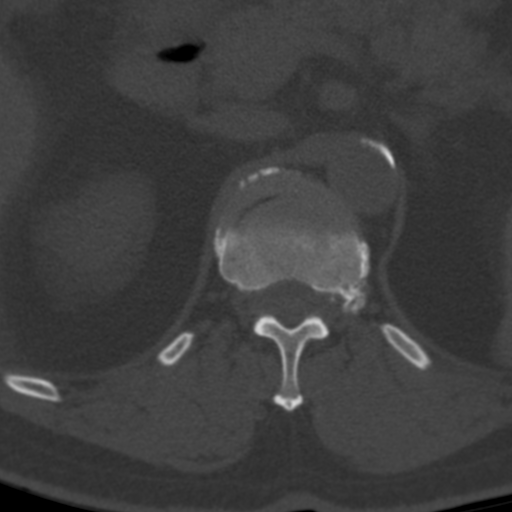

[Series 6: l spine coronal · coronal · 0.44mm/px · 3 of 90 slices shown]
[im 18/90  bone]
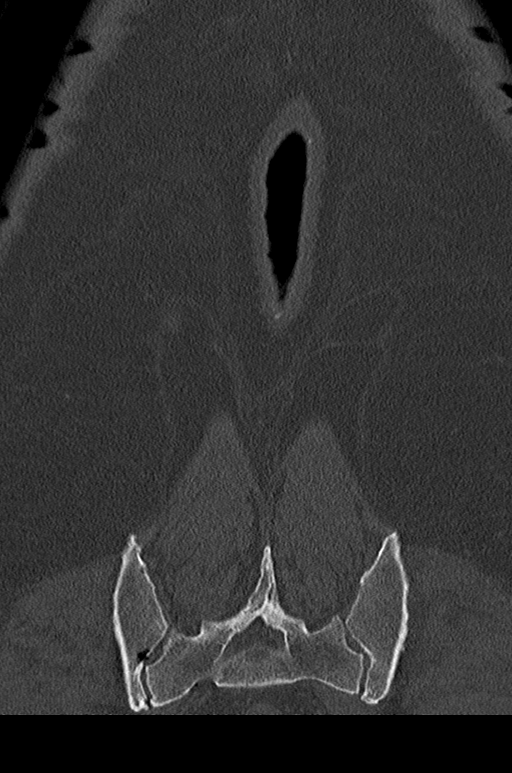
[im 36/90  bone]
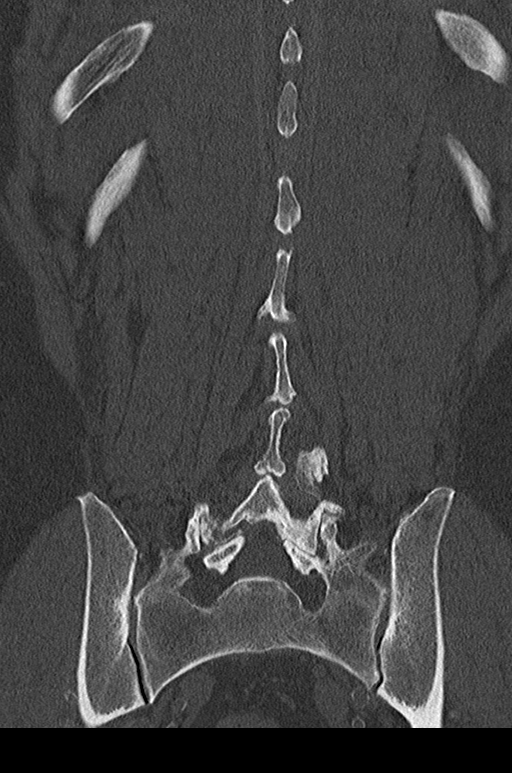
[im 54/90  bone]
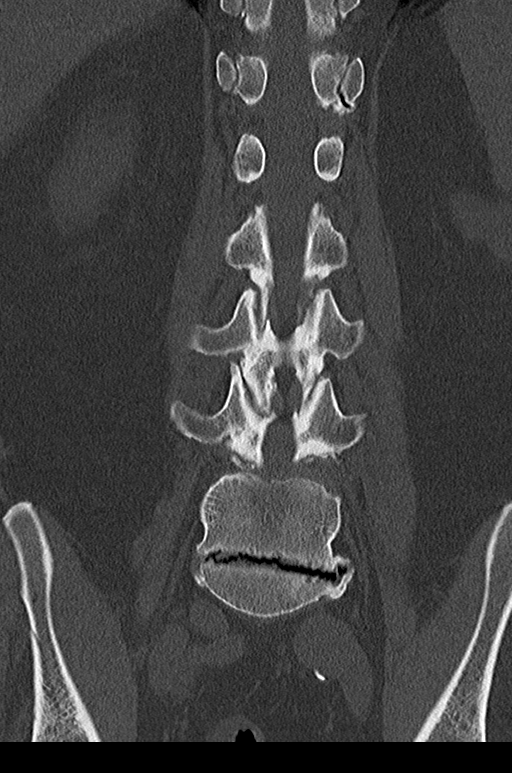

[Series 8: sag · sagittal · 0.41mm/px · 5 of 111 slices shown, 6 images]
[im 37/111  bone]
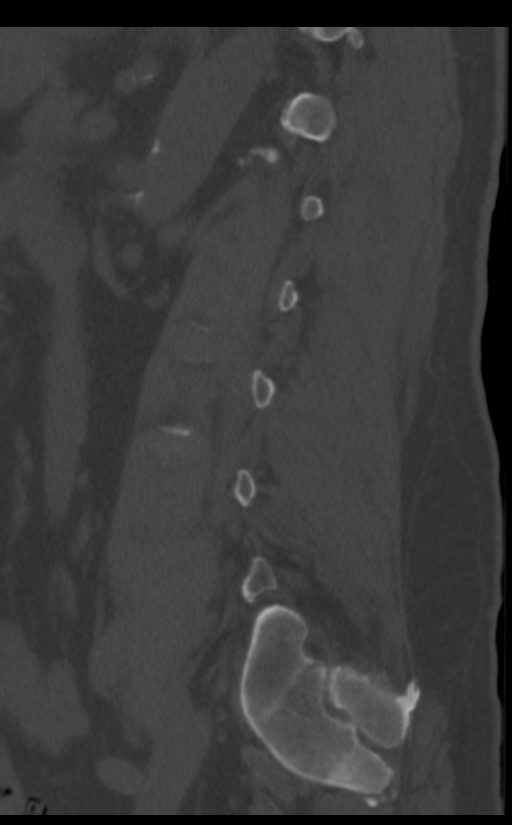
[im 46/111  bone]
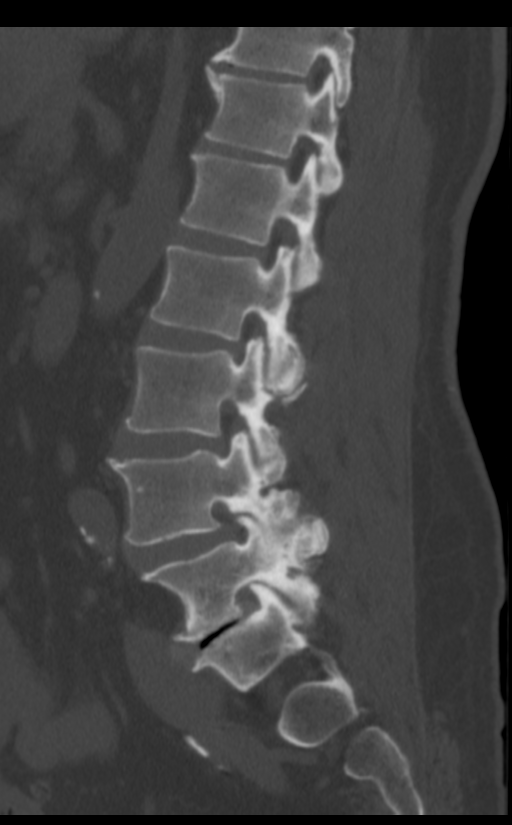
[im 56/111  soft-tissue]
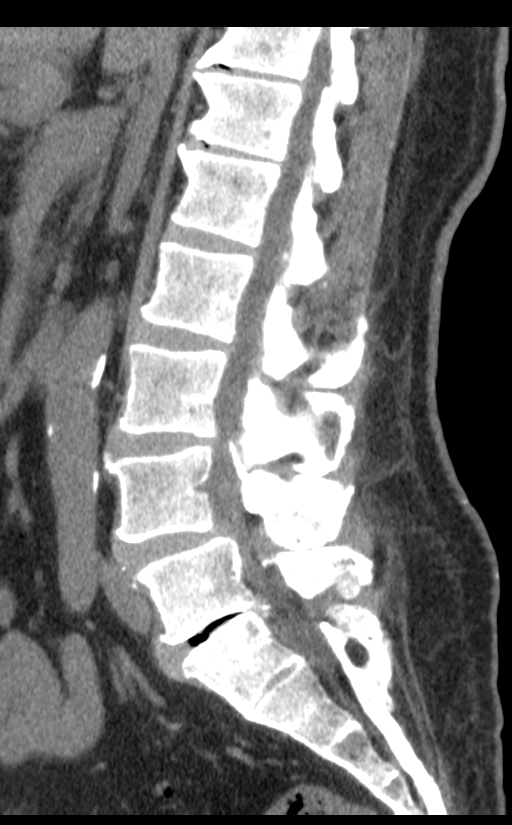
[im 56/111  bone]
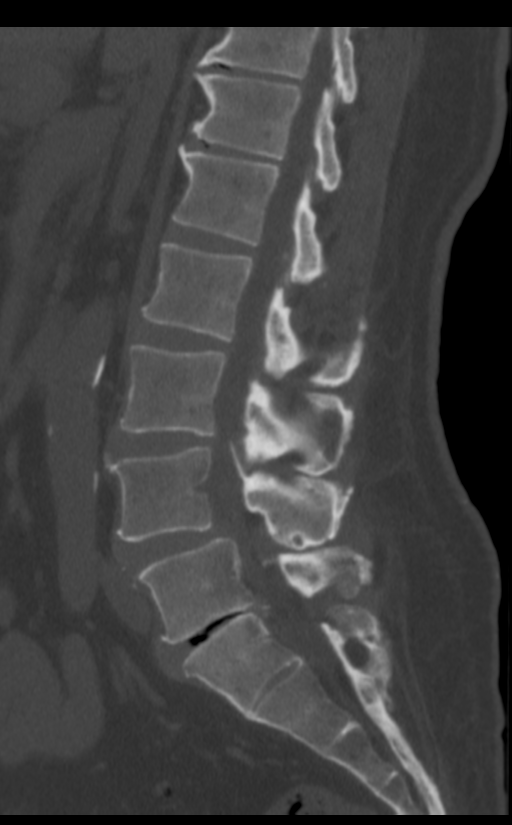
[im 65/111  bone]
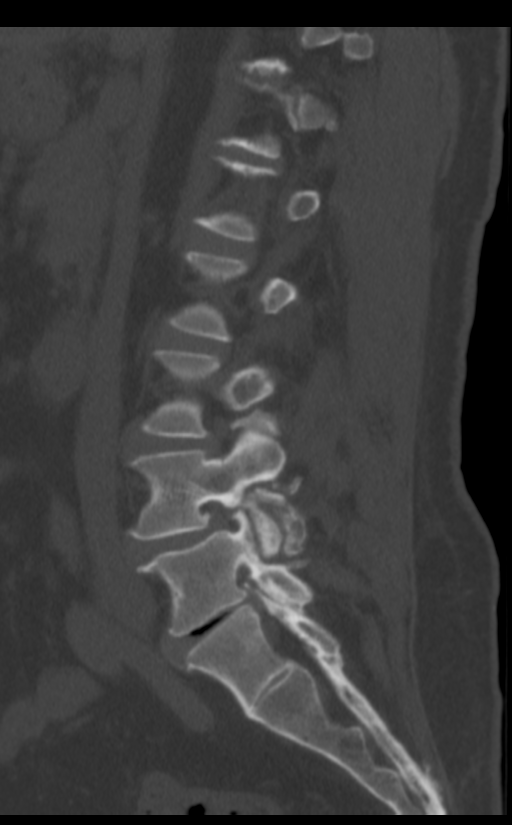
[im 74/111  bone]
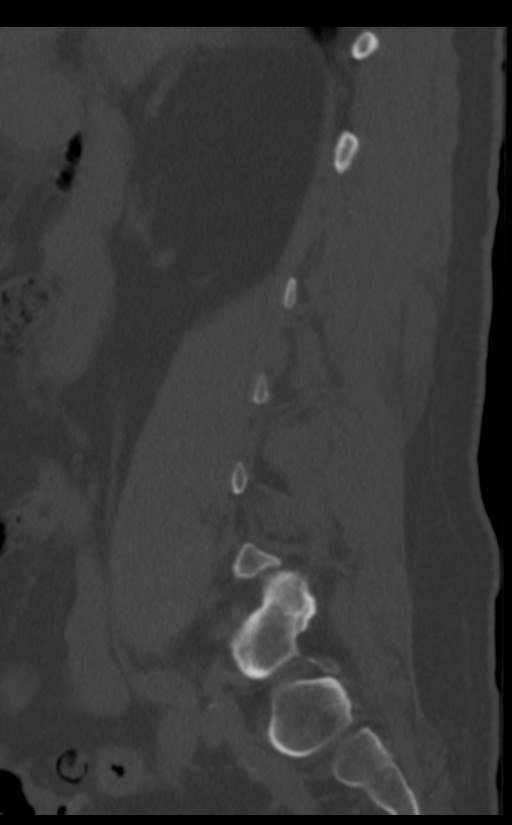

[12 of 33 positions shown; findings below may reference images not displayed]

FINDINGS: Lumbar vertebral heights are intact. There is less than grade 1 
anterolisthesis at L4-5 measuring approximately 5-6 mm. Marked disc narrowing at 
L5-S1 with vacuum phenomenon. No fracture or bone destruction. Mild lower lumbar 
levocurvature. 
At L5-S1 the canal is open. There is stenosis of the upper S1 lateral recesses, 
worse on the left. There is marked bilateral foraminal stenosis due to facet 
hypertrophy. 
At L4-5 there is marked canal stenosis. There are marked facet degenerative 
changes and bilateral foraminal stenosis. 
At L3-4 there is mild canal stenosis. Mild right foraminal narrowing. Moderate 
to marked facet degenerative change. 
At L2-3 and L1-2 the canal and foramina are open. 
Marked disc narrowing at T11-12 and T12-L1 without significant canal stenosis.
IMPRESSION: Marked canal stenosis at L4-5 with marked lateral foraminal stenosis. Less than 
grade 1 degenerative anterolisthesis at this level. No pars defect. There are 
marked facet degenerative changes. 
Stenosis of the upper S1 lateral recesses worse on the left. Marked bilateral 
L5-S1 foraminal stenosis. 
Findings are similar to the recent MRI appearance.  
RADIATION DOSE REDUCTION: All CT scans are performed using radiation dose 
reduction techniques, when applicable.  Technical factors are evaluated and 
adjusted to ensure appropriate moderation of exposure.  Automated dose 
management technology is applied to adjust the radiation doses to minimize 
exposure while achieving diagnostic quality images.

## 2021-12-21 IMAGING — DX LUMBAR SPINE AP, LAT WITH FLEXION AND EXTEN
4 series · 4 of 4 positions shown · non-contrast
Comparison: MR lumbar same day, Plain film and CT 07/08/2021,  MR lumbar 05/04/2021 
plain film 04/16/2021

________________________________________________________________________________________________ 
LUMBAR SPINE AP, LAT WITH FLEXION AND EXTEN, 12/21/2021 [DATE]: 
CLINICAL INDICATION: Spondylolisthesis, Lumbar Region.

[AP]
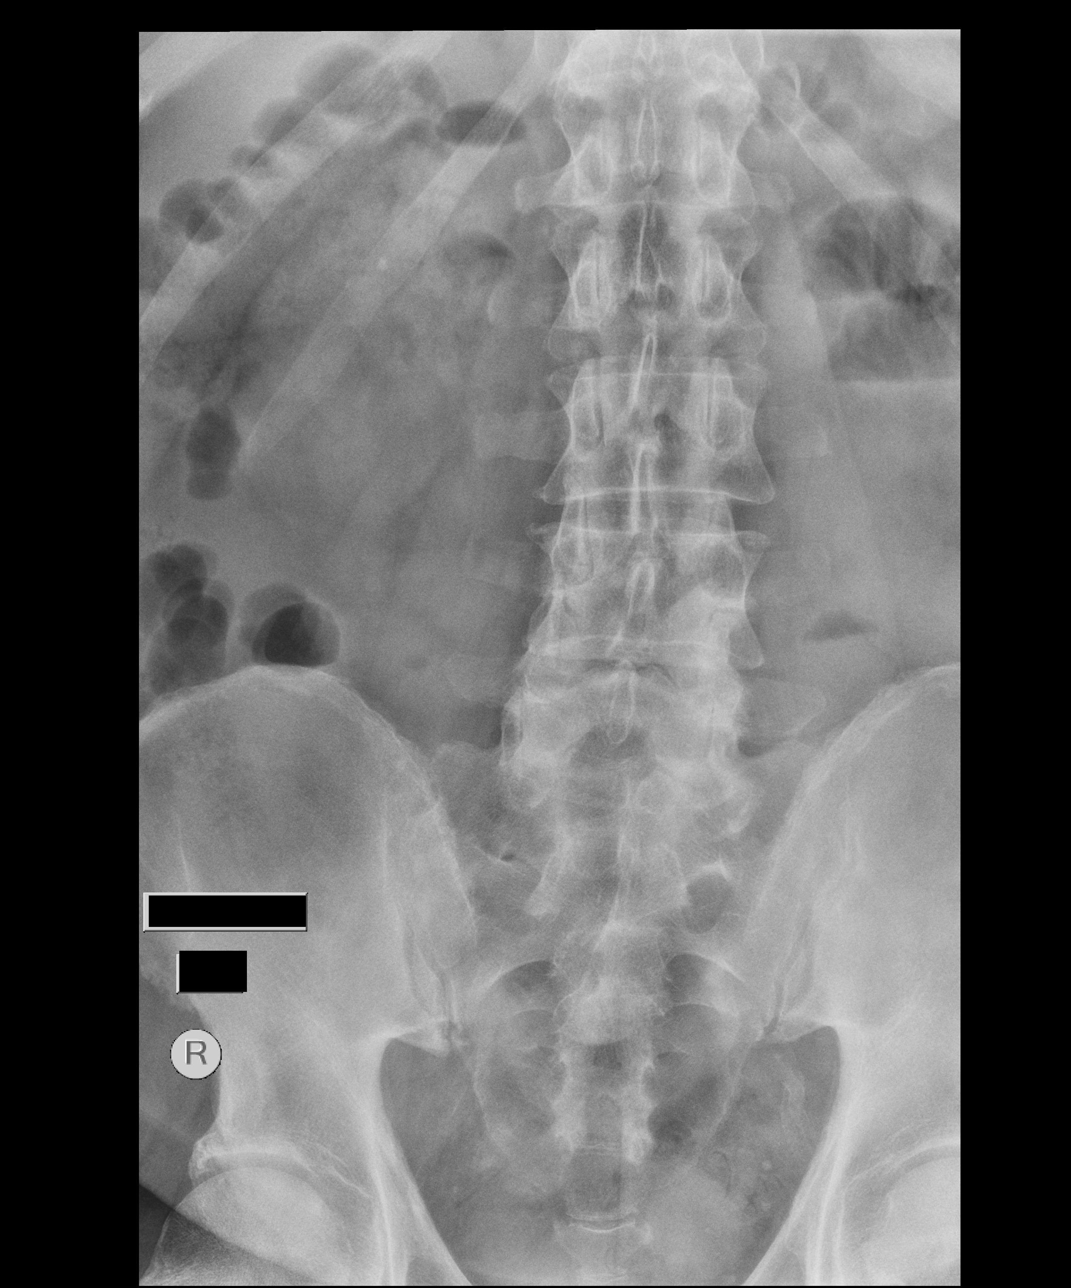

[lateral (1 of 3)]
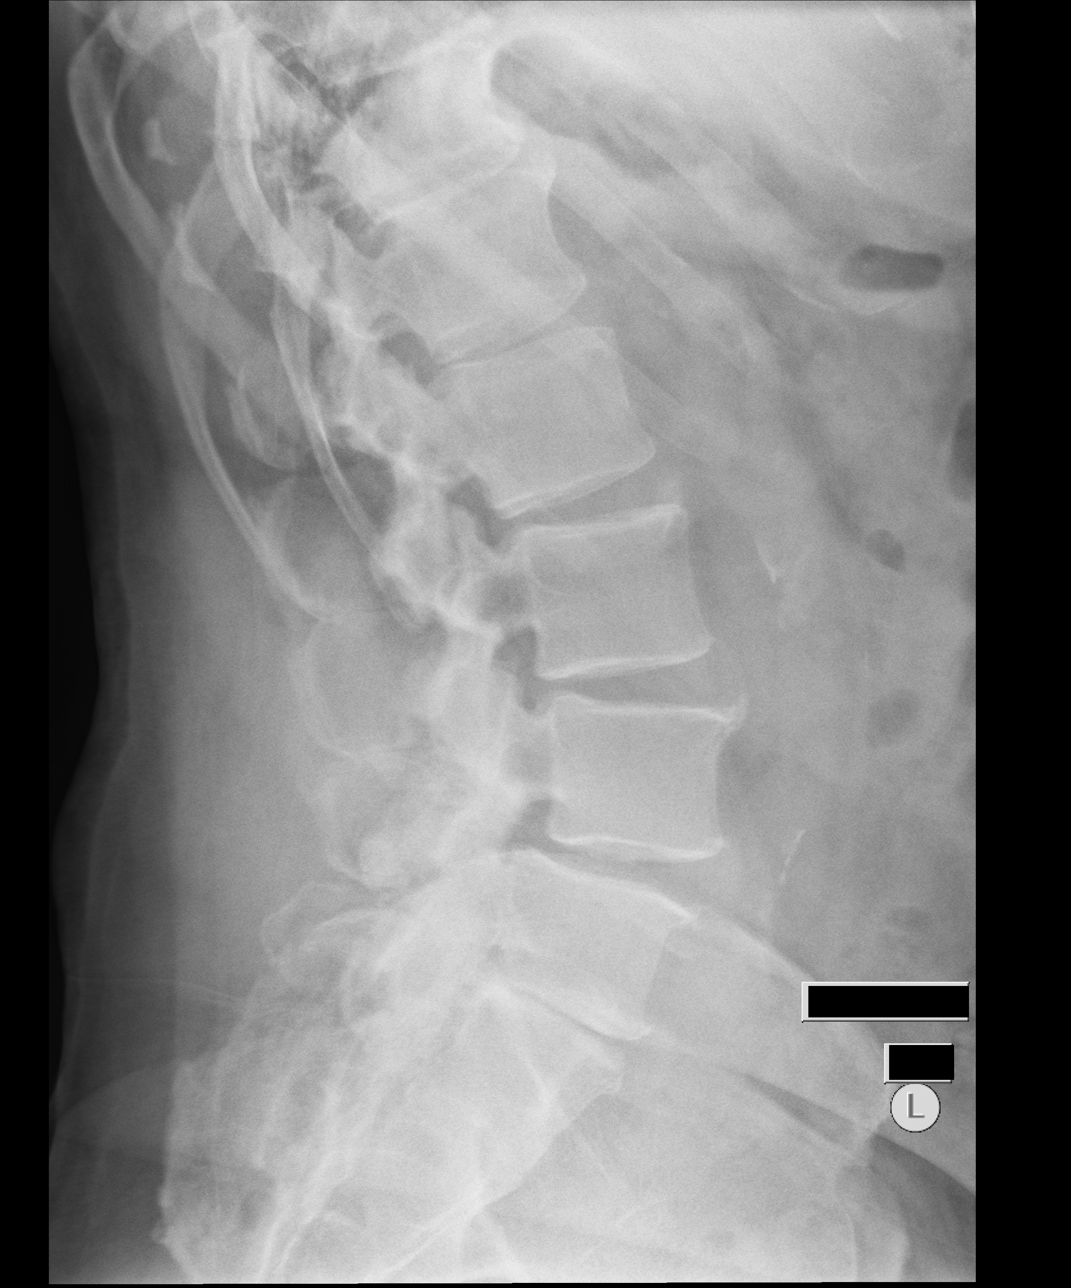

[lateral (2 of 3)]
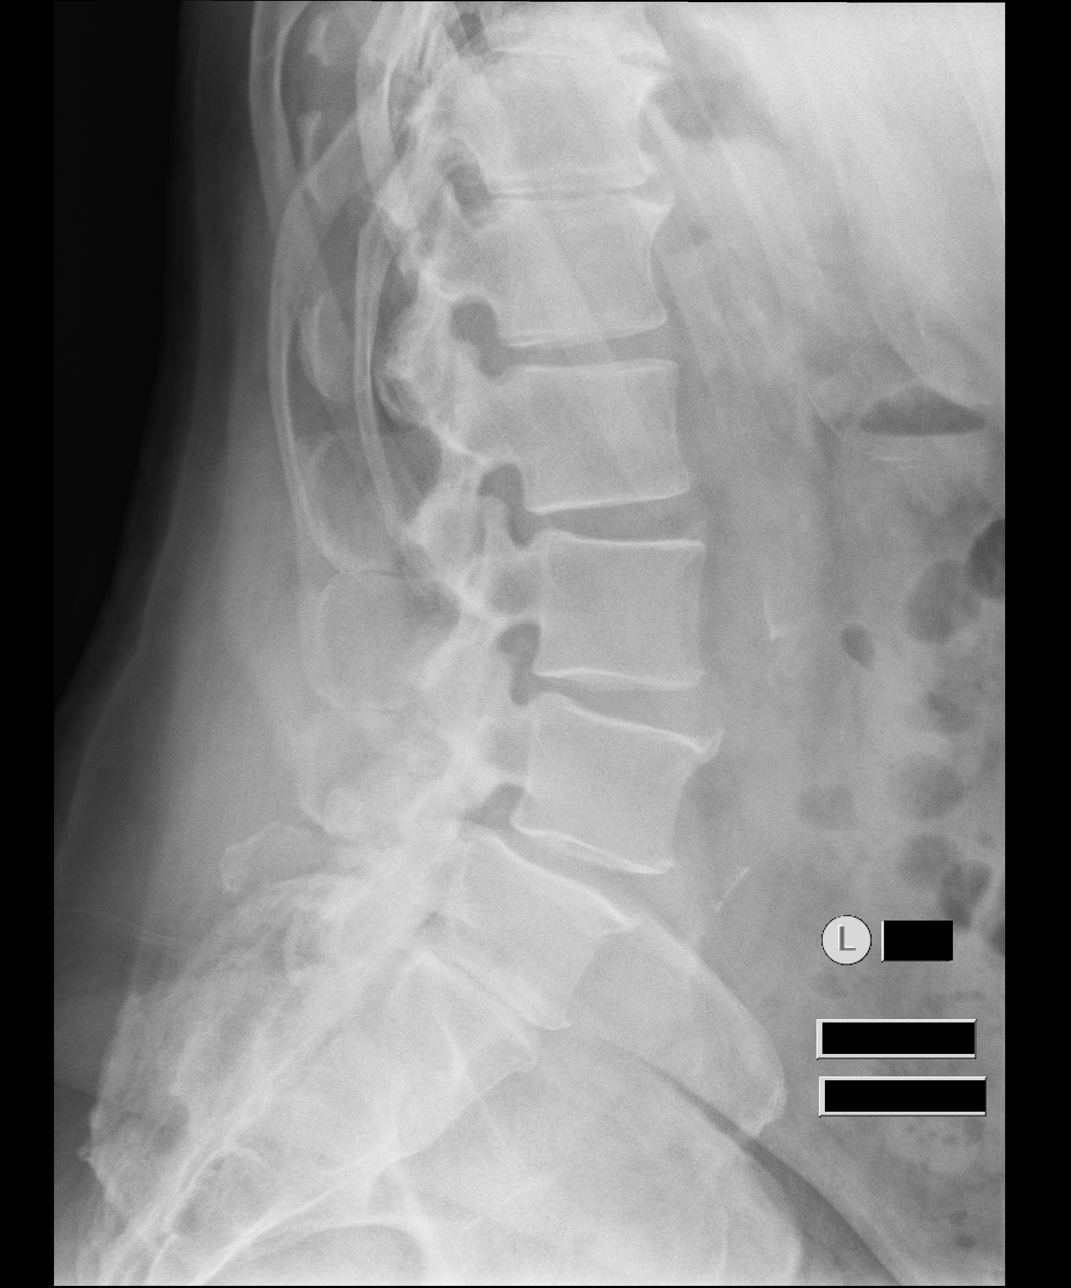

[lateral (3 of 3)]
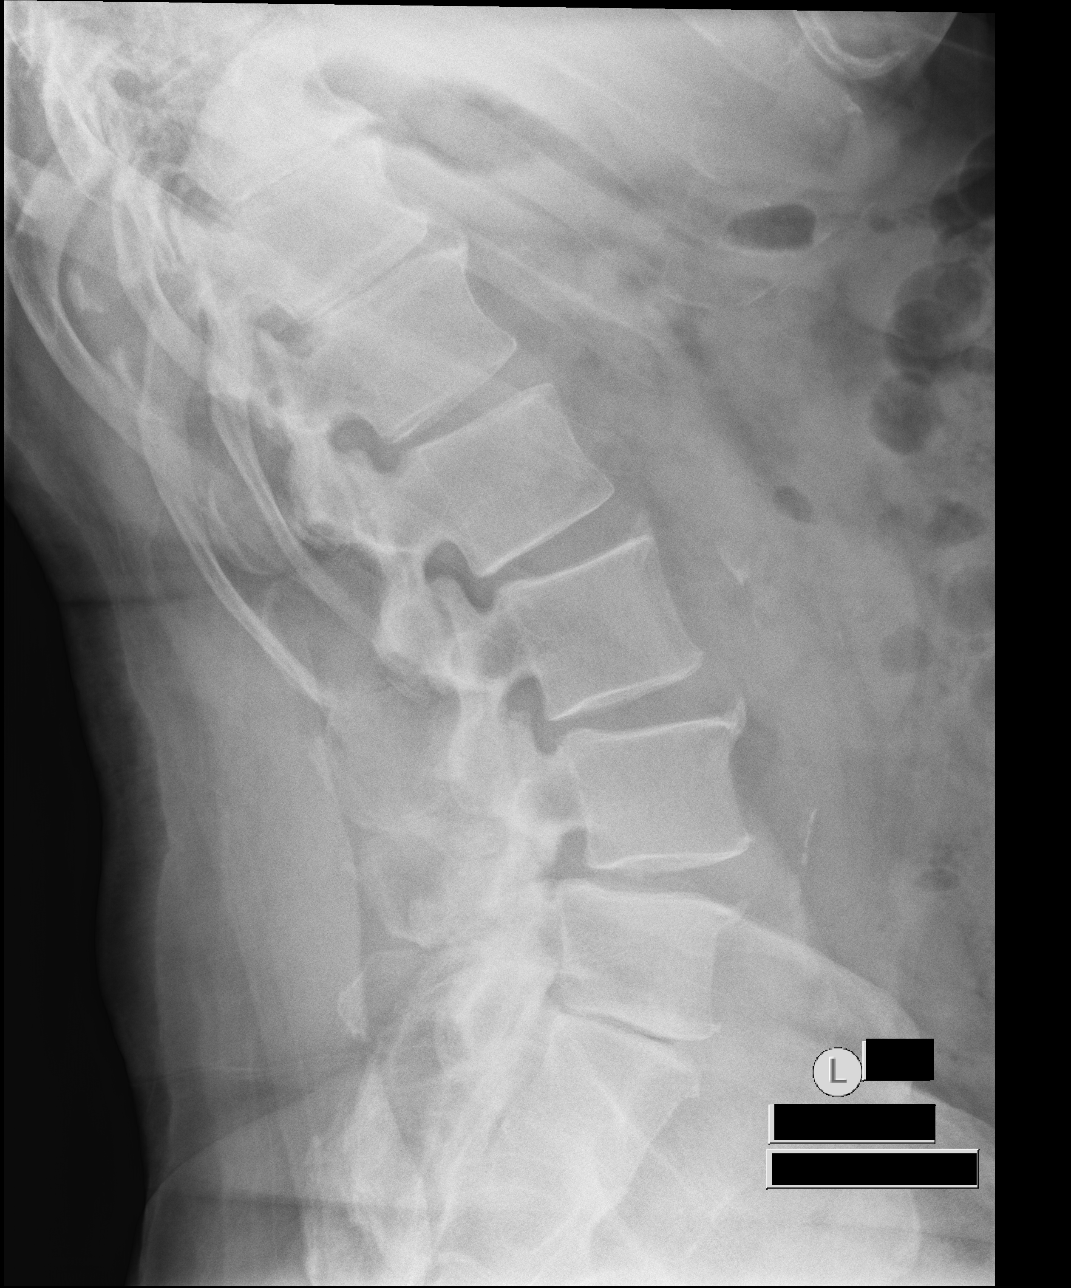

[4 of 4 positions shown; findings below may reference images not displayed]

FINDINGS: 5 nonrib-bearing lumbar-type vertebral bodies. 0.8 cm anterolisthesis 
L4/L5 without dynamic instability. Degenerative spondylosis most severe 
lumbosacral junction. Severe lower lumbar facet arthritis. Mildly hypertrophic 
spinous processes. Minimal lumbar levoscoliosis. SI joints are open. Sacral 
arches intact. 0.4 cm RIGHT renal calculus . Vascular calcifications.
IMPRESSION: 1.  Stable 0.8 cm L4/L5 anterolisthesis. 
2.  Advanced lower lumbar facet arthritis. 
3.  Advanced lumbosacral degenerative spondylosis 
4.  0.4 cm RIGHT renal calculus.

## 2021-12-21 IMAGING — DX THORACIC SPINE 2 VIEWS
5 series · 5 of 5 positions shown · non-contrast
Comparison: None.

________________________________________________________________________________________________ 
THORACIC SPINE 2 VIEWS, 12/21/2021 [DATE]: 
CLINICAL INDICATION: Remote history of previous thoracic fracture T5, T6 and T7, 
now with back pain, bilateral leg numbness, pelvic and perineal pain

[AP (1 of 2)]
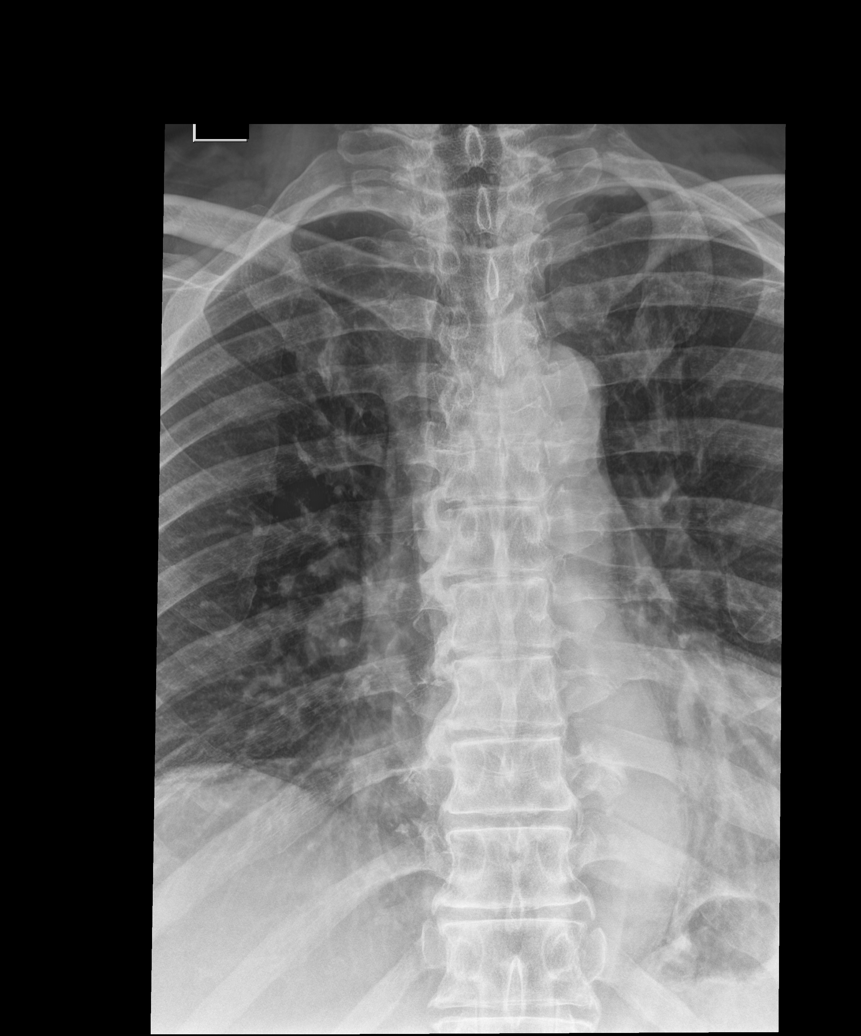

[AP (2 of 2)]
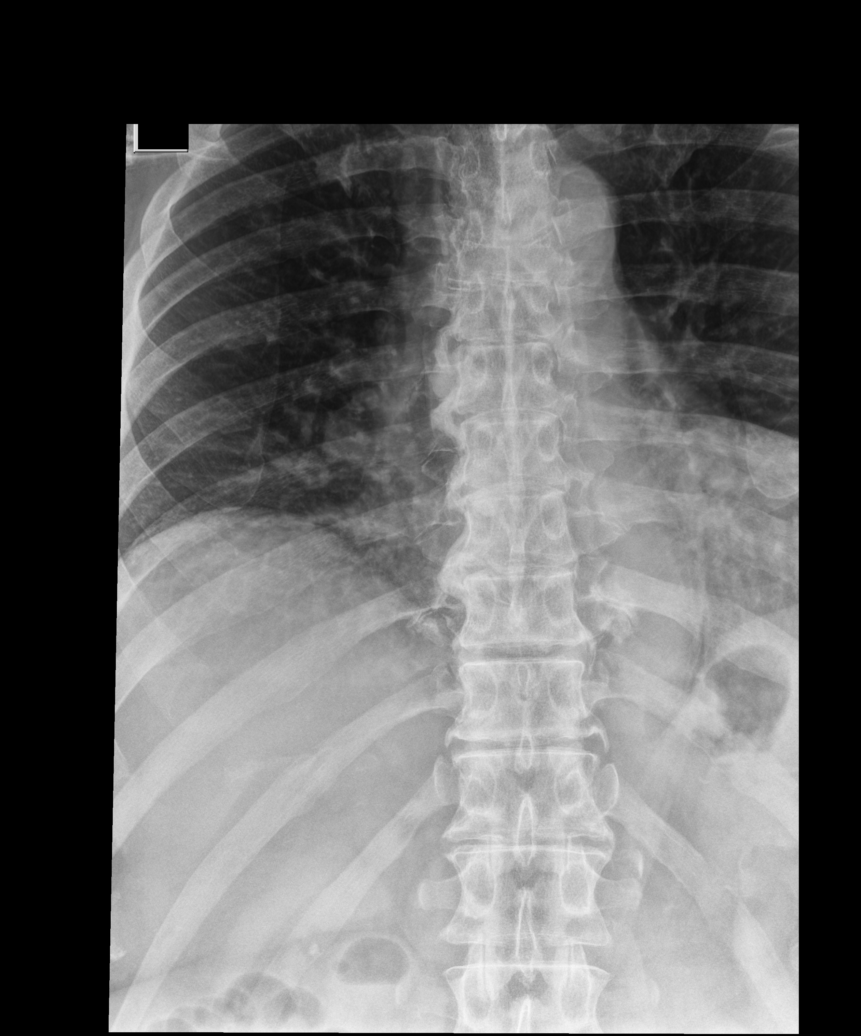

[lateral (1 of 2)]
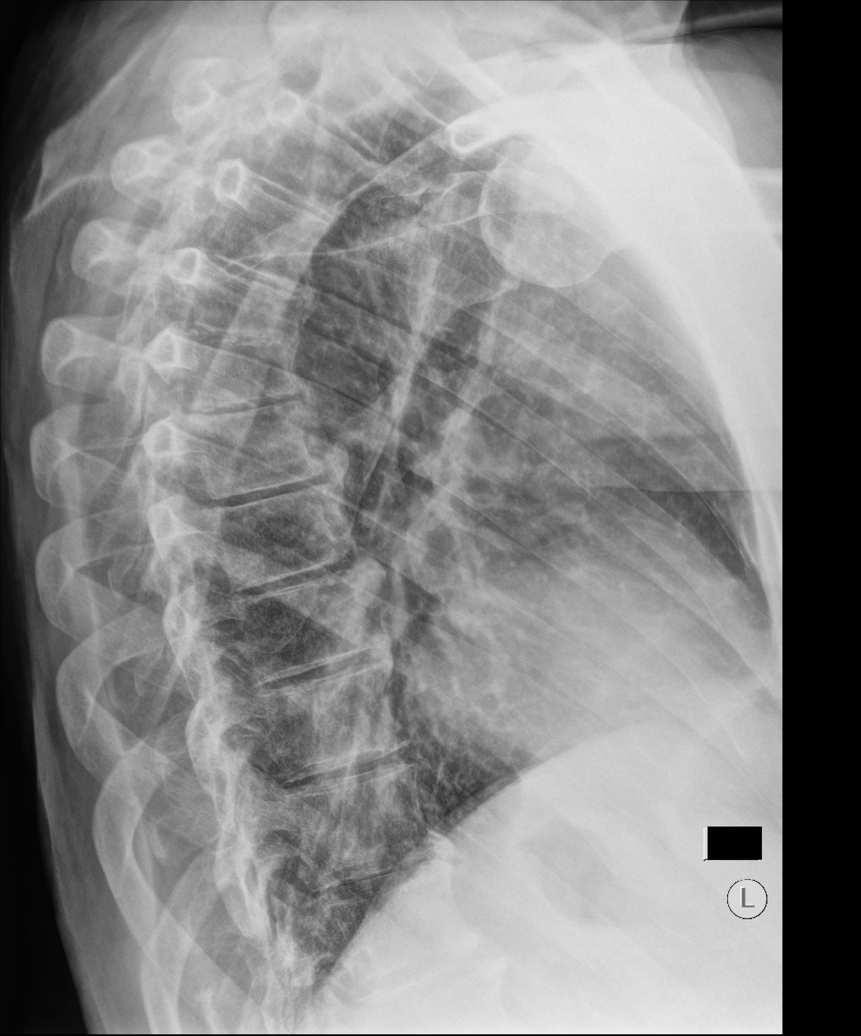

[lateral (2 of 2)]
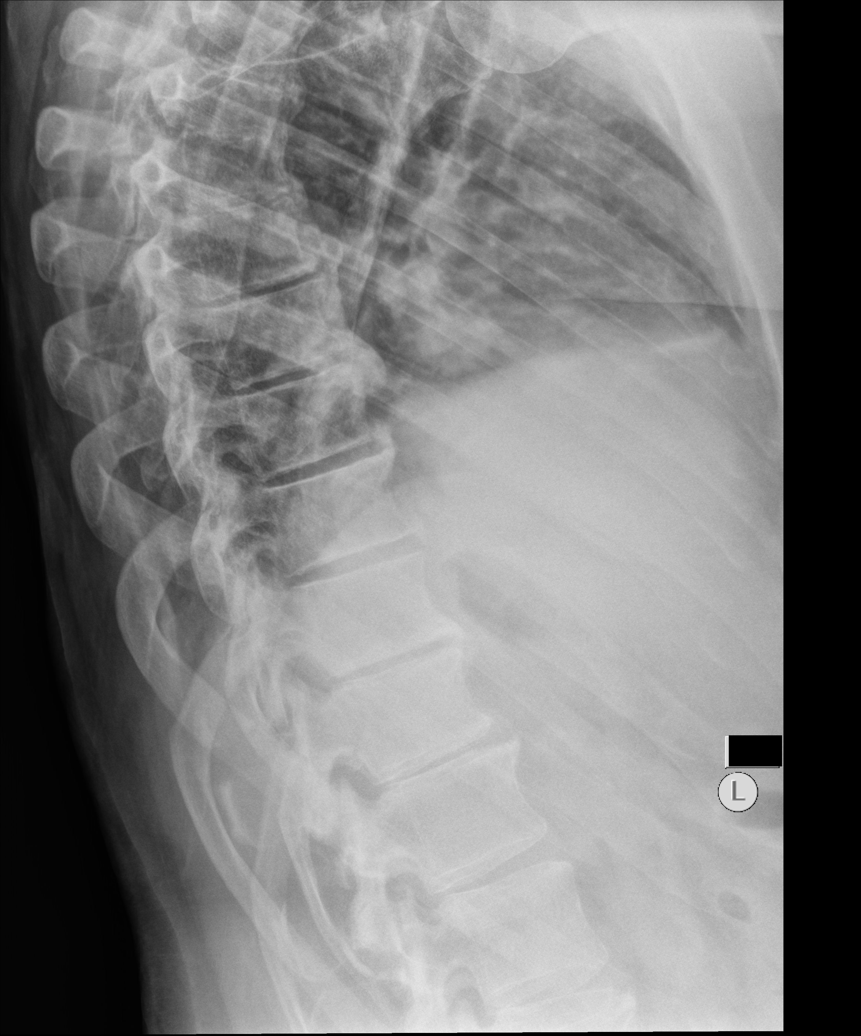

[swimmer]
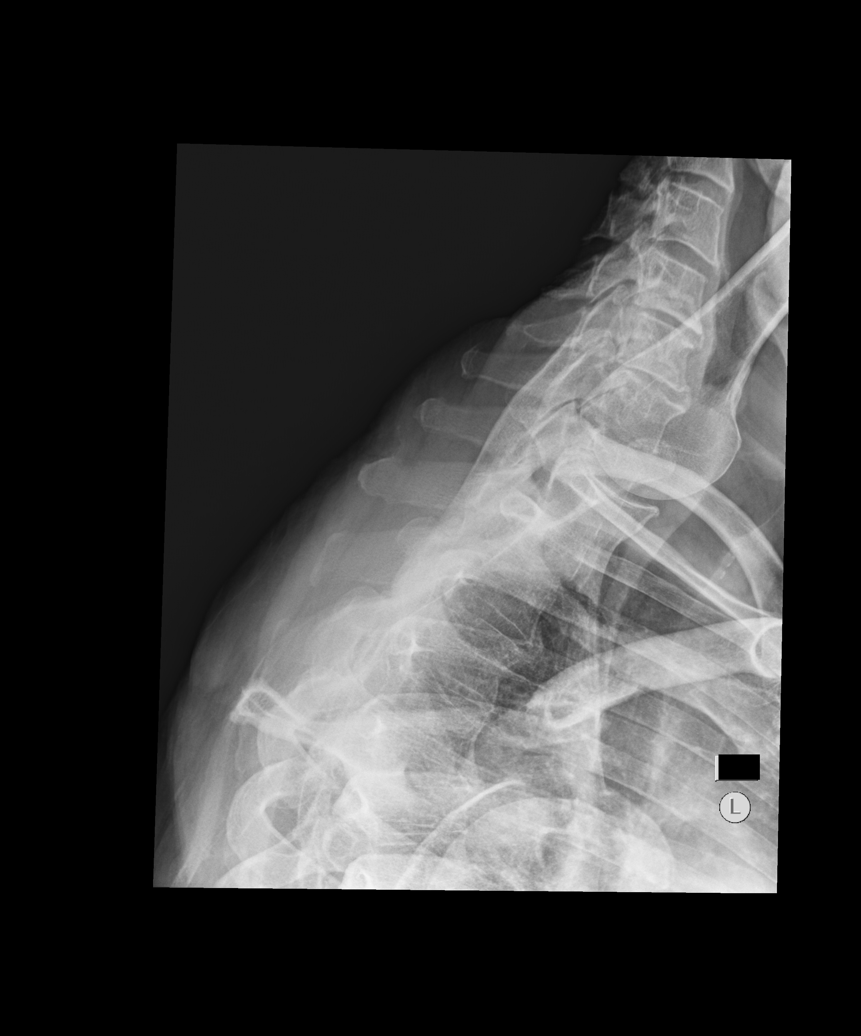

[5 of 5 positions shown; findings below may reference images not displayed]

FINDINGS: Normal bony mineralization. No significant scoliosis. T5/T6 old 
posttraumatic deformity with ankylosis and focal exaggerated kyphosis. Mild 
multilevel spondylosis. Bridging osteophytes. Tortuous aorta. Normal alignment.
IMPRESSION: 1.  Old T5/T6 chronic anterior wedge compression deformity with focal 
exaggerated kyphosis.  
2.  Scattered degenerative spondylosis and spurring.

## 2021-12-21 IMAGING — DX PELVIS 2 VIEW
2 series · 2 of 2 positions shown · non-contrast
Comparison: none

________________________________________________________________________________________________ 
PELVIS 2 VIEW, 12/21/2021 [DATE]: 
CLINICAL INDICATION: Pain.
TECHNIQUE: 2 views of the pelvis

[AP]
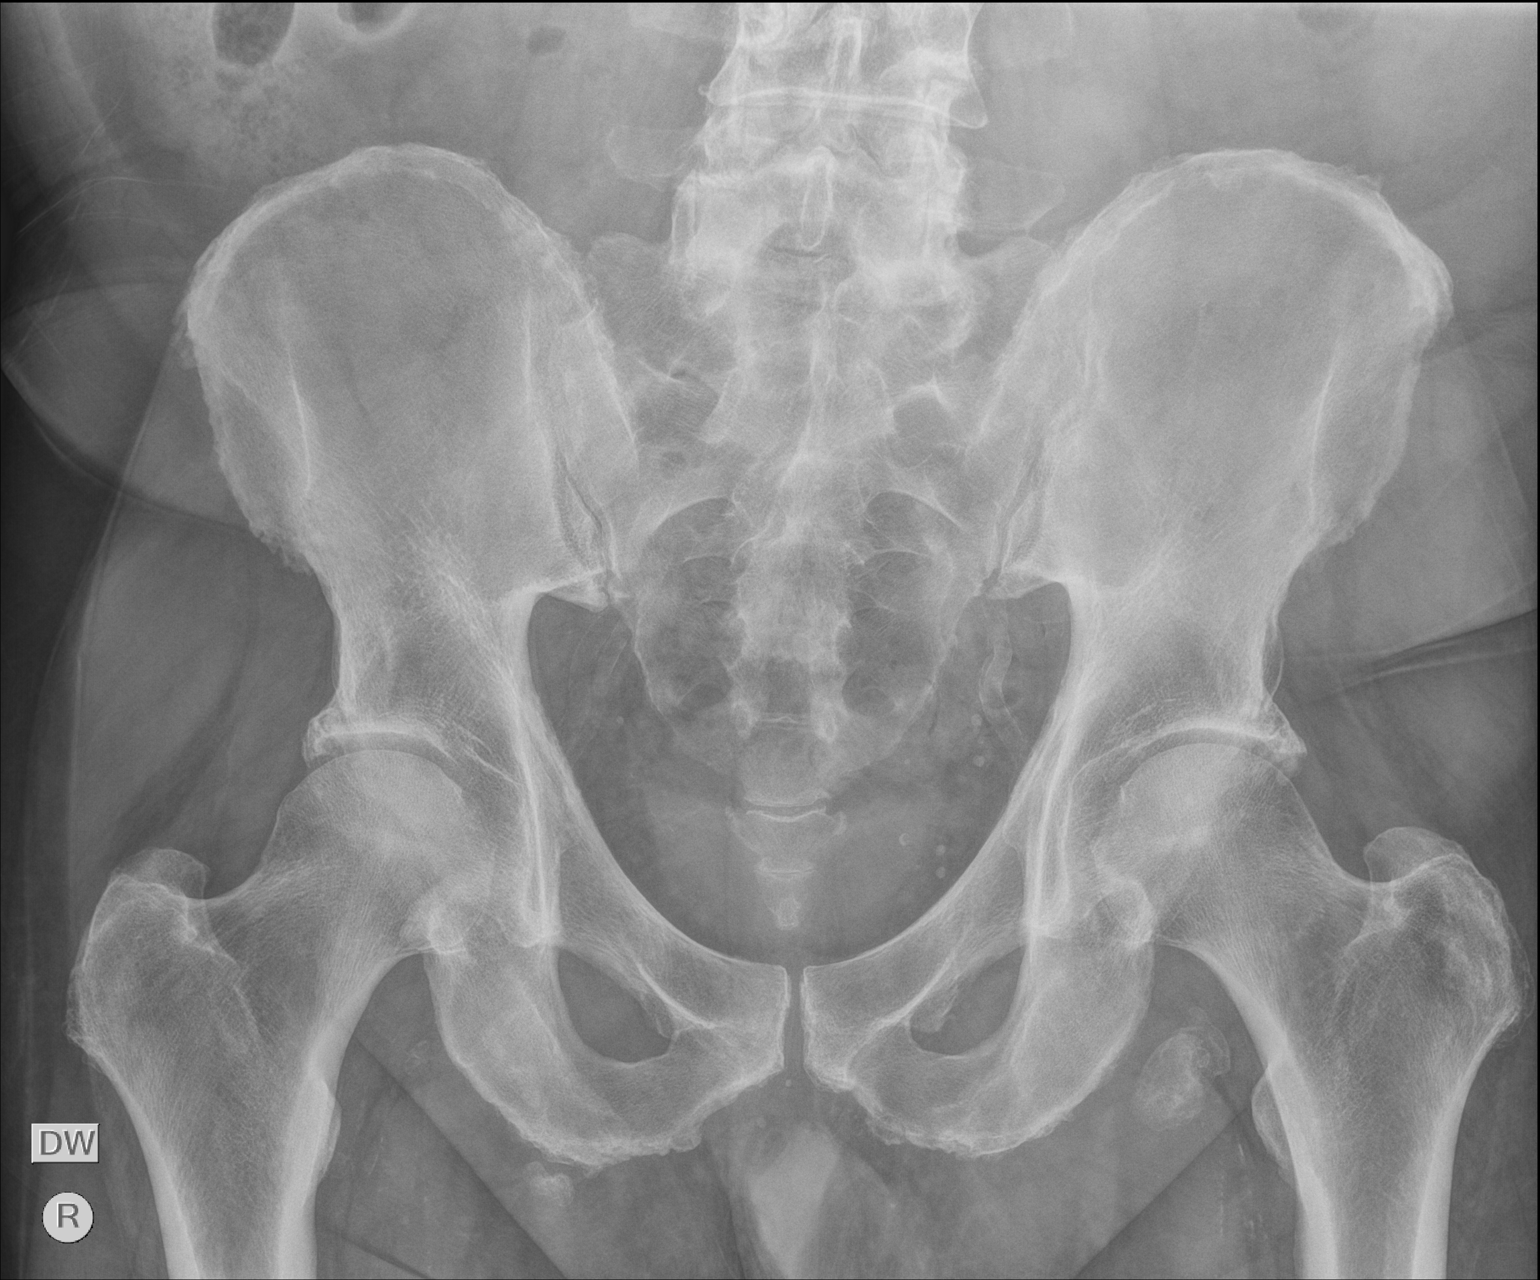

[special]
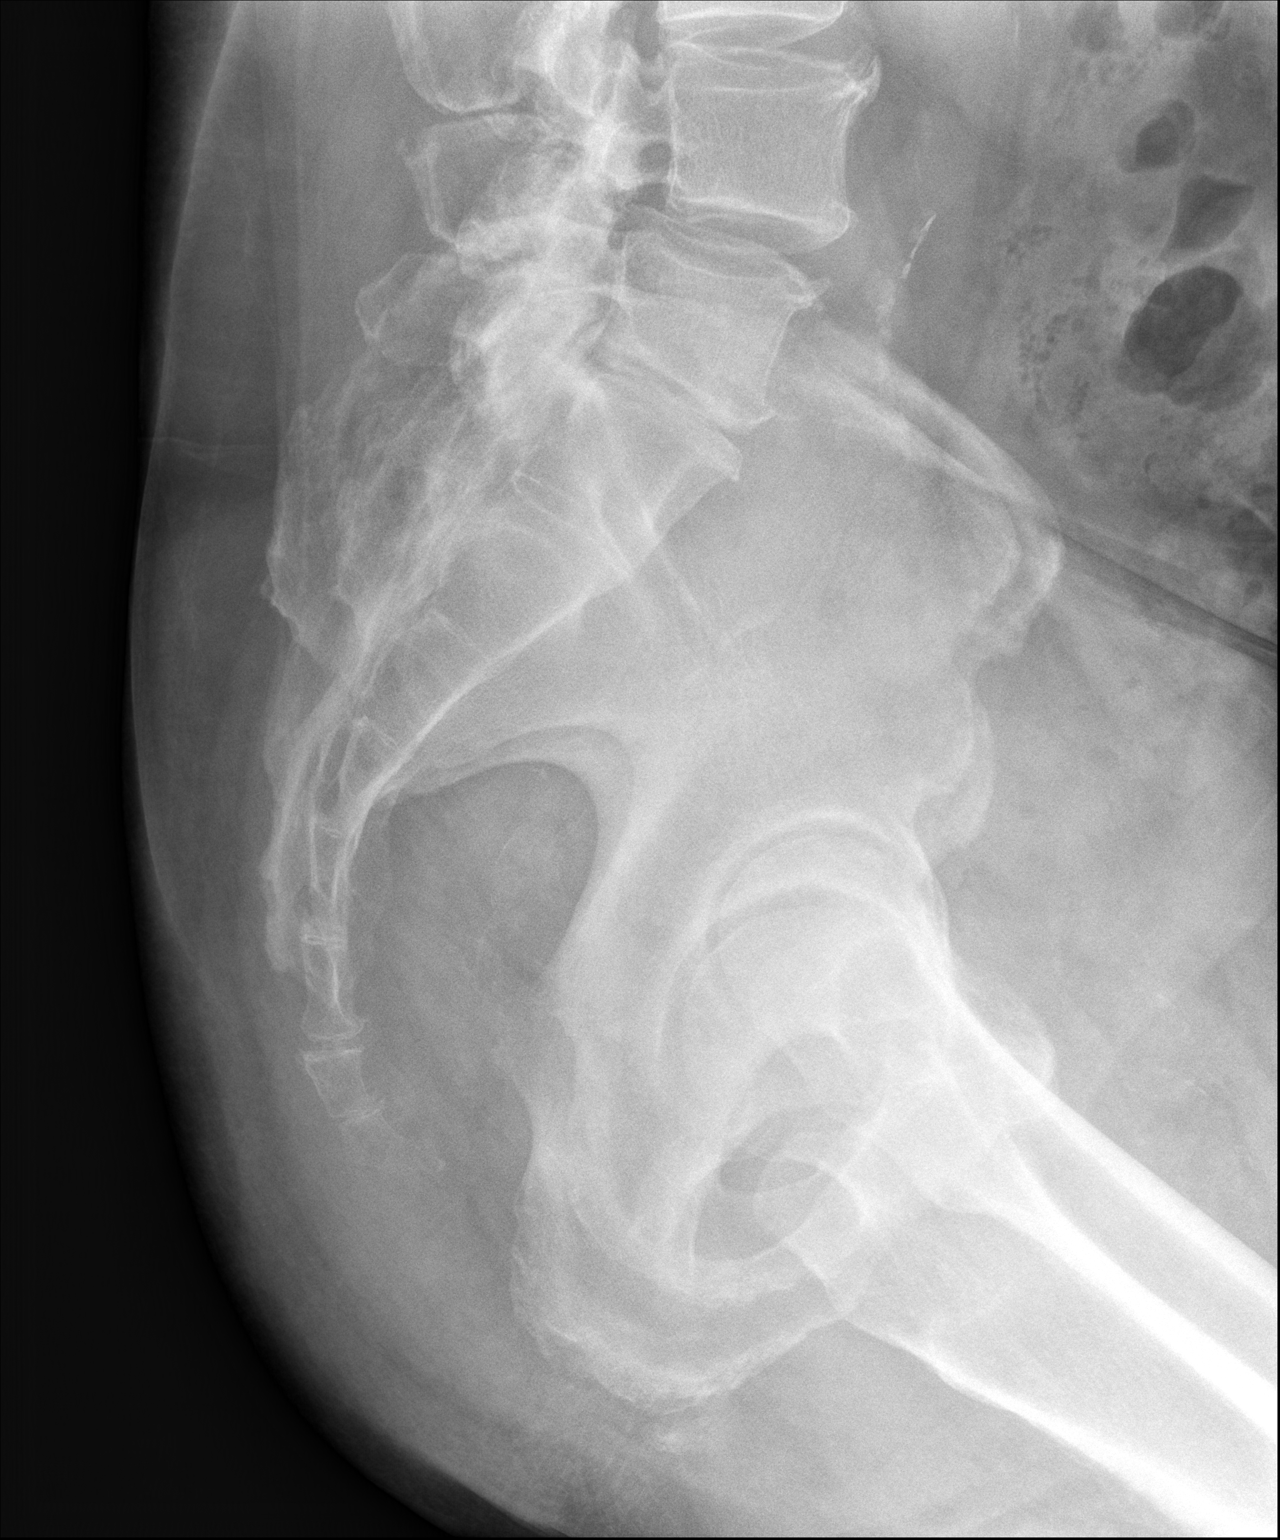

[2 of 2 positions shown; findings below may reference images not displayed]

FINDINGS: Hip joint spaces appear preserved. Mild degenerative changes included 
lower lumbar spine and SI joints. No fracture. Normal alignment on these 2 
views. Scattered vascular calcifications. There are well-corticated 
calcifications adjacent to the ischial bilaterally, potentially dystrophic.
IMPRESSION: Degenerative changes. If symptoms persist, consideration could be made for MR 
exam.

## 2021-12-21 IMAGING — MR MRI LUMBAR SPINE W/WO CONTRAST
6 of 14 series · 10 of 48 positions shown · non-contrast
Comparison: MRI thoracic spine December 21, 2021.

________________________________________________________________________________________________ 
MRI LUMBAR SPINE W/WO CONTRAST, 12/21/2021 [DATE]: 
CLINICAL INDICATION: Low back pain with lower extremity radiculopathy. No recent 
trauma. History of T5 fracture.
TECHNIQUE: Multiplanar, multiecho position MR images of the lumbar spine were 
performed without and with 11.5 mL of Gadovist were injected intravenously by 
hand. 3.5 mL of Gadovist were discarded. Patient was scanned on a 1.5T magnet.

[Series 801: survey · axial · 10.0mm · 1.25mm/px · 1 of 10 slices shown]
[im 1/10]
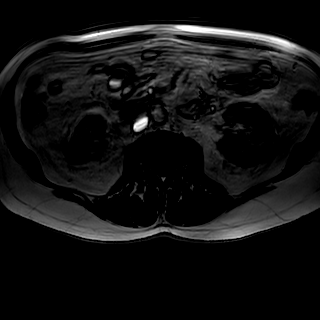

[Series 901: t2w_cor-surv · coronal · 6.0mm · 0.62mm/px · 1 of 10 slices shown]
[im 1/10]
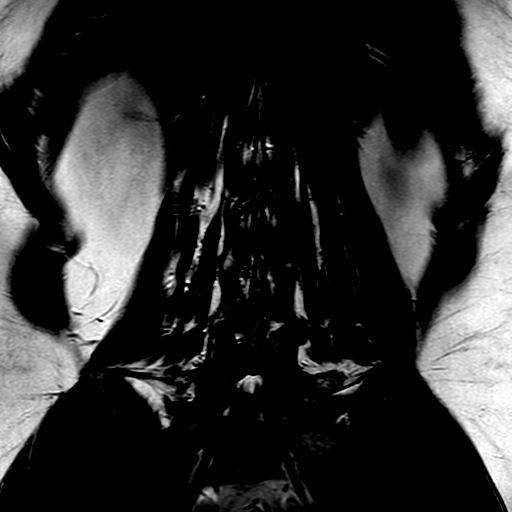

[Series 1001: t1_tse_sag · sagittal · 4.0mm · 0.41mm/px · 2 of 18 slices shown]
[im 1/18]
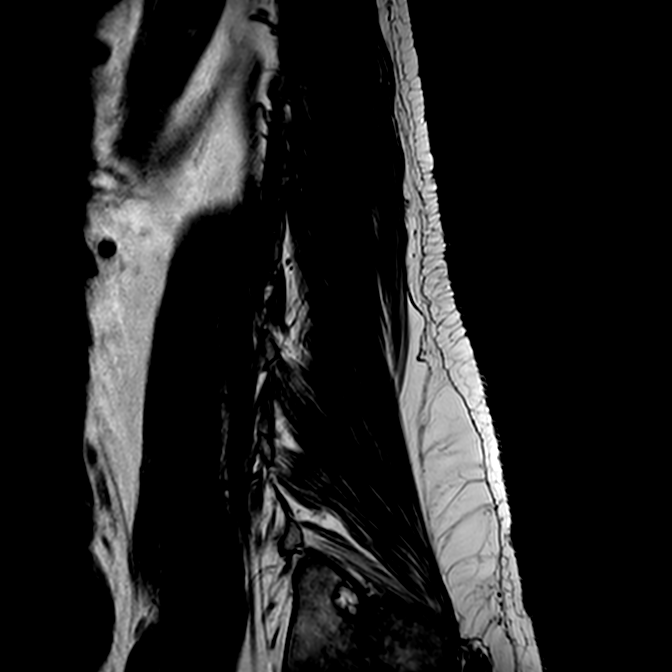
[im 18/18]
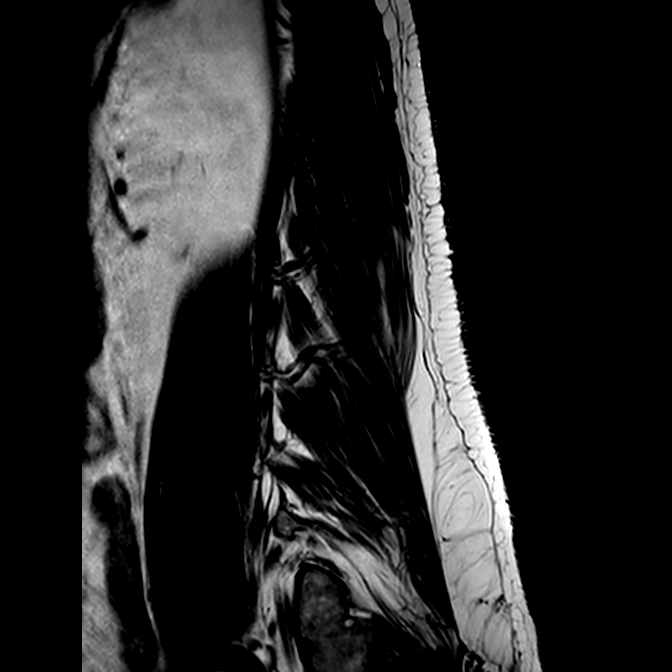

[Series 1102: (id)_mdixon_tse · sagittal · 4.0mm · 0.49mm/px · 2 of 18 slices shown]
[im 1/18]
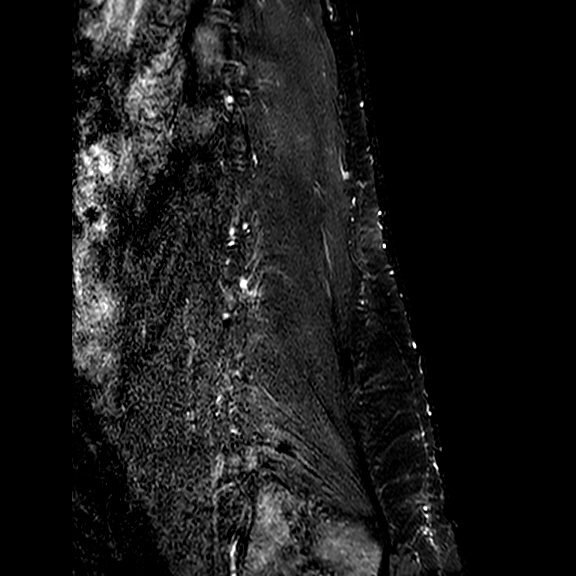
[im 18/18]
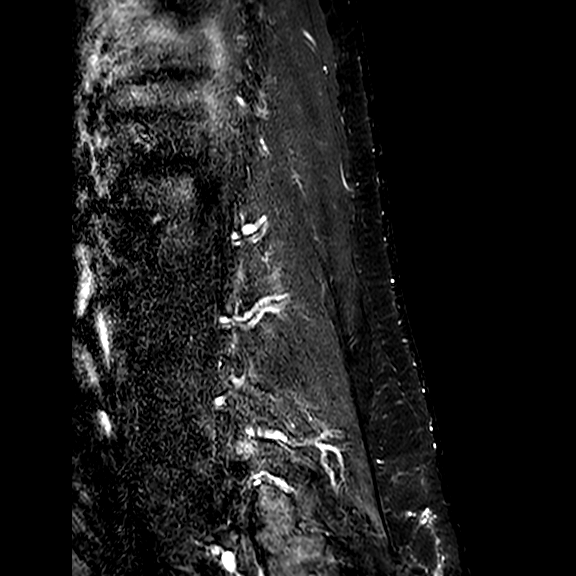

[Series 1103: st2w_mdixon_tse · sagittal · 4.0mm · 0.49mm/px · 2 of 18 slices shown]
[im 1/18]
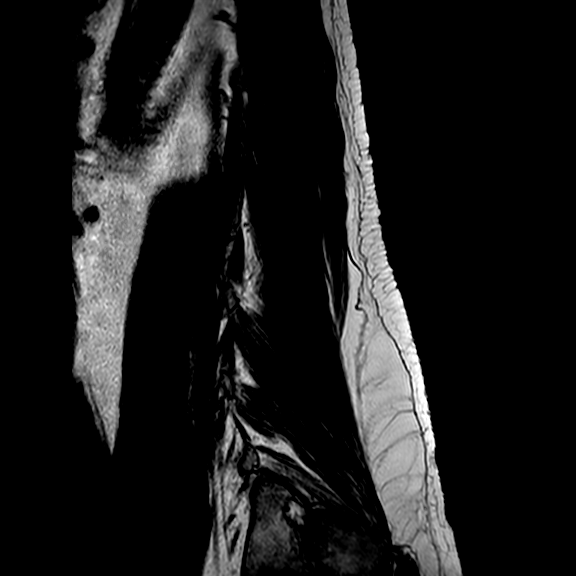
[im 18/18]
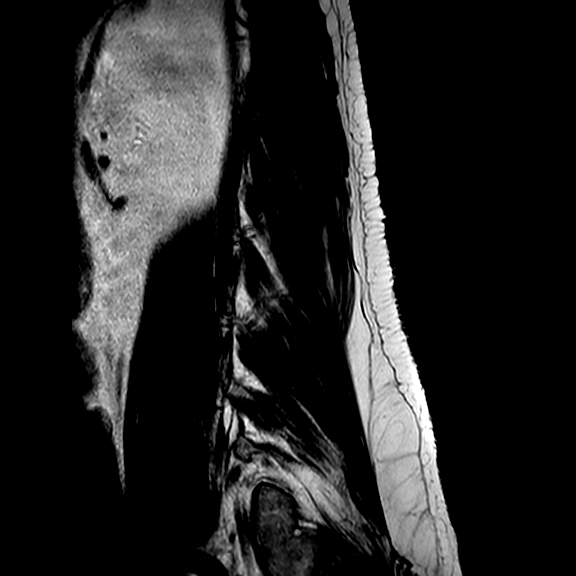

[Series 1202: (id) view_ax mpr · axial · 1.0mm · 0.25mm/px · z∈[-521,-498]mm · 2 of 86 slices shown]
[im 1/86]
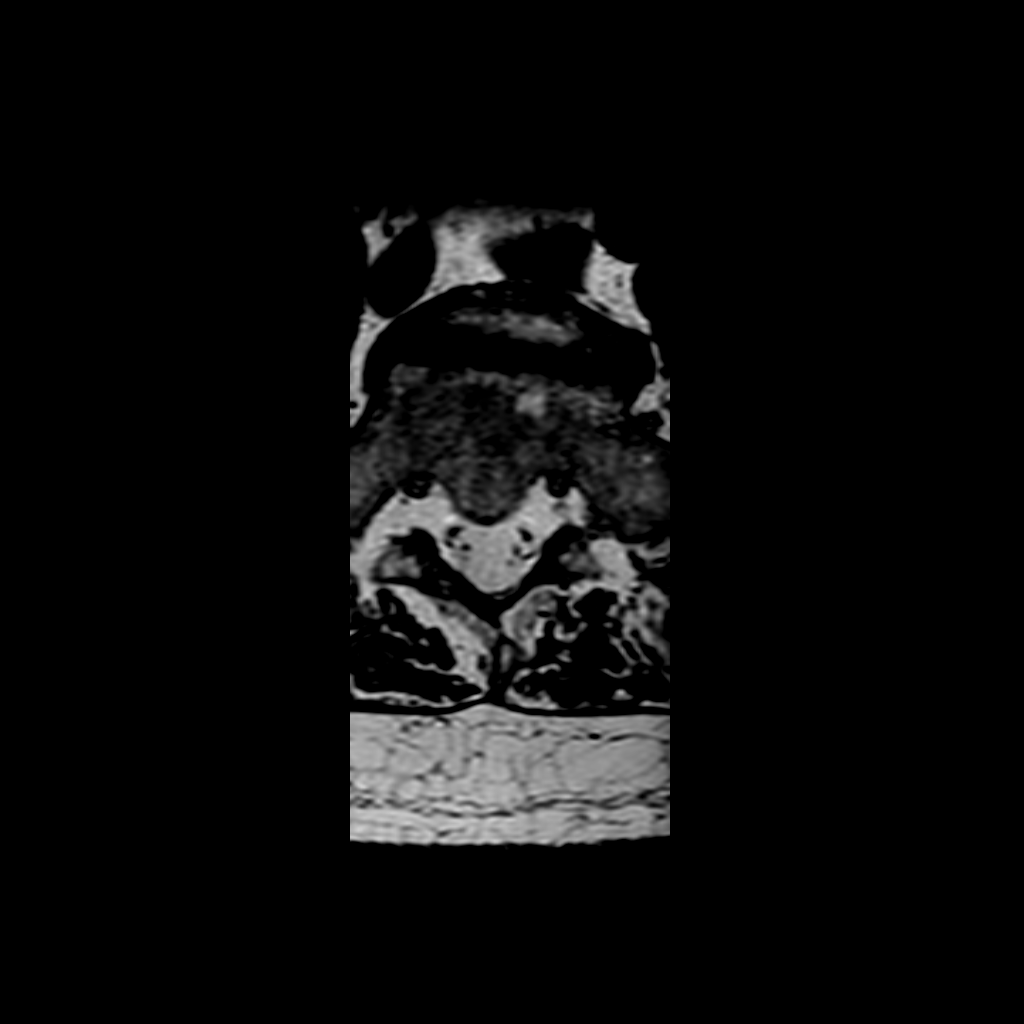
[im 11/86]
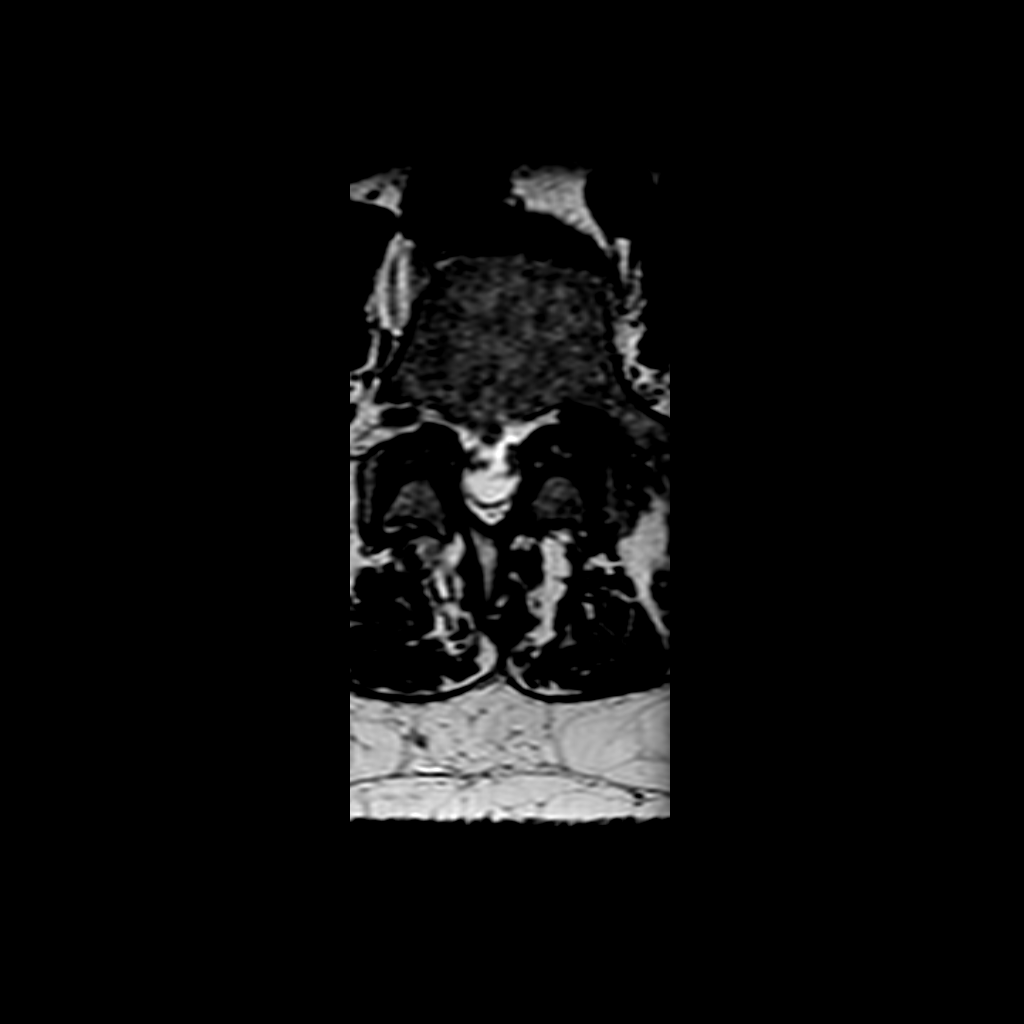

[10 of 48 positions shown; findings below may reference images not displayed]

Lumbar spine x-rays December 21, 2021 and thoracic spine x-rays September 21, 2021. Lumbar spine x-ray July 08, 2021 and MRI lumbar spine May 04, 2021. CT lumbar spine July 08, 2021.
FINDINGS: -------------------------------------------------------------------------------- 
------ 
GENERAL: 
5 lumbar type vertebral bodies.     
ALIGNMENT: Minimal levoconvex lumbar scoliosis. There is grade 1 anterolisthesis 
L4 on L5 which is stable to previous MRI study. Otherwise anatomic sagittal 
alignment. 
VERTEBRAL BODY HEIGHT: Normal.  
MARROW SIGNAL: No focal suspect signal abnormality. S2 hemangioma. No abnormal 
enhancement. 
CORD SIGNAL: Normal distal spinal cord and cauda equina. Conus medullaris 
terminates at L1. 
ADDITIONAL FINDINGS: None. 
Modic I-II: L5-S1 
Ligamentum Flavum > 2.5 mm: All levels. 
-------------------------------------------------------------------------------- 
------ 
SEGMENTAL: 
T12-L1: Moderate loss of disc height. Loss of disc signal. Anterior marginal 
osteophytes. Otherwise normal. Stable to prior MRI study. 
L1-L2: Loss of disc signal. Otherwise normal. Stable to prior MRI study. 
L2-L3: Mild loss of disc height posteriorly. Loss of disc signal. Mild facet 
arthropathy. Canal and foramina are patent. Stable to prior MRI study. 
L3-L4: Mild to moderate loss of disc height posteriorly. Loss of disc signal. 
Mild diffuse annular bulge. Mild canal stenosis. Facet arthropathy with 
ligamentum flavum hypertrophy. Foramina patent. Degree of canal stenosis has 
progressed. 
L4-L5: Mild to moderate loss of disc height specifically posteriorly. Loss of 
disc signal. Anterolisthesis with disc uncovering. Severe canal stenosis with a 
degree of cauda equina compression. Canal measures 3 mm in AP dimension. Lateral 
recess narrowing bilaterally. Small bilateral facet joint effusions with 
significant ligamentum flavum hypertrophy. Mild-to-moderate left foraminal 
narrowing. Moderate right foraminal narrowing. Stable. 
L5-S1: Moderate loss of disc height and signal. Annular bulge. This narrows the 
lateral recesses and abuts the traversing S1 nerve roots bilaterally with mild 
canal stenosis. Facet arthropathy. Moderate bilateral foraminal narrowing. 
Stable. 
-------------------------------------------------------------------------------- 
------
IMPRESSION: L3-L4, mild canal stenosis has slightly progressed since prior MRI study. 
L4-L5, stable appearing severe canal stenosis with a degree of cauda equina 
compression. Lateral recess narrowing bilaterally. 
L5-S1, stable appearing at least moderate bilateral foraminal narrowing. 
Other stable less significant degenerative changes as above.

## 2021-12-21 IMAGING — MR MRI THORACIC SPINE W/WO CONTRAST
5 of 11 series · 10 of 48 positions shown · IV contrast (gadolinium)
Comparison: MRI lumbar spine December 21, 2021.

________________________________________________________________________________________________ 
MRI THORACIC SPINE W/WO CONTRAST, 12/21/2021 [DATE]: 
CLINICAL INDICATION: Injury in 1171. Chronic spinal pain. Progressive symptoms. 
Compression fracture of thoracic vertebrae.
TECHNIQUE: Multiplanar, multiecho position MR images of the thoracic spine were 
performed without and with intravenous gadolinium enhancement.  11 mL of 
Gadavist were injected intravenously by hand. 4.0 mL of Gadavist were discarded. 
Patient was scanned on a 1.5T magnet.

[Series 101: t2_sag_count · sagittal · 4.0mm · 0.62mm/px · 3 of 22 slices shown]
[im 1/22]
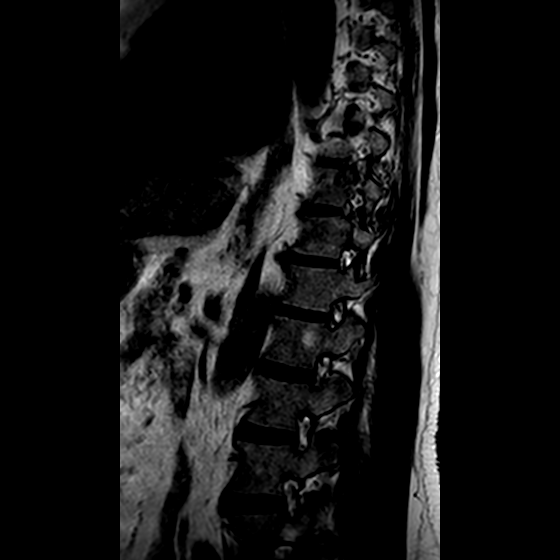
[im 11/22]
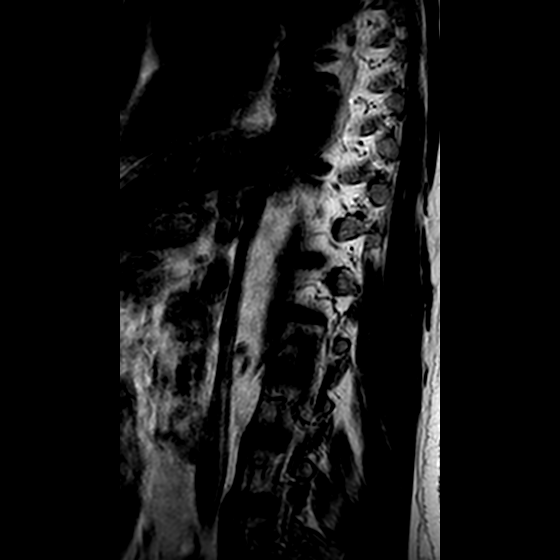
[im 22/22]
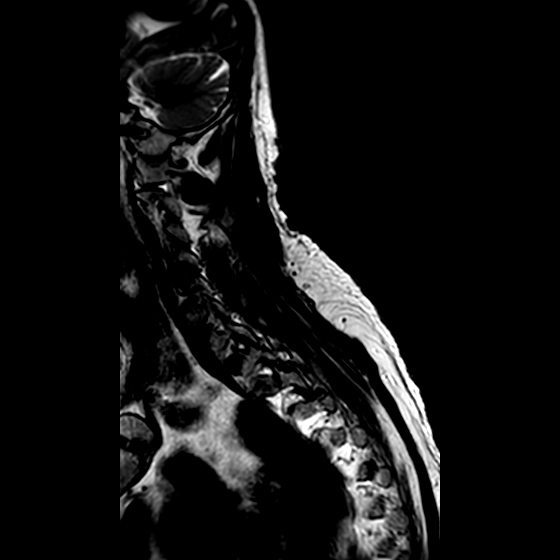

[Series 102: T2 · sagittal · 4.0mm · 0.62mm/px · 2 of 11 slices shown]
[im 1/11]
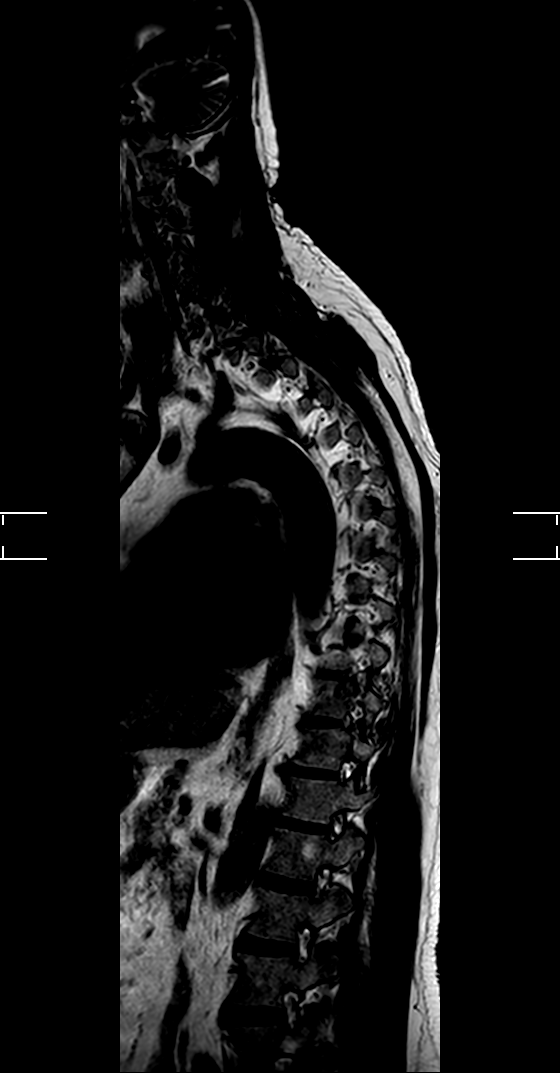
[im 11/11]
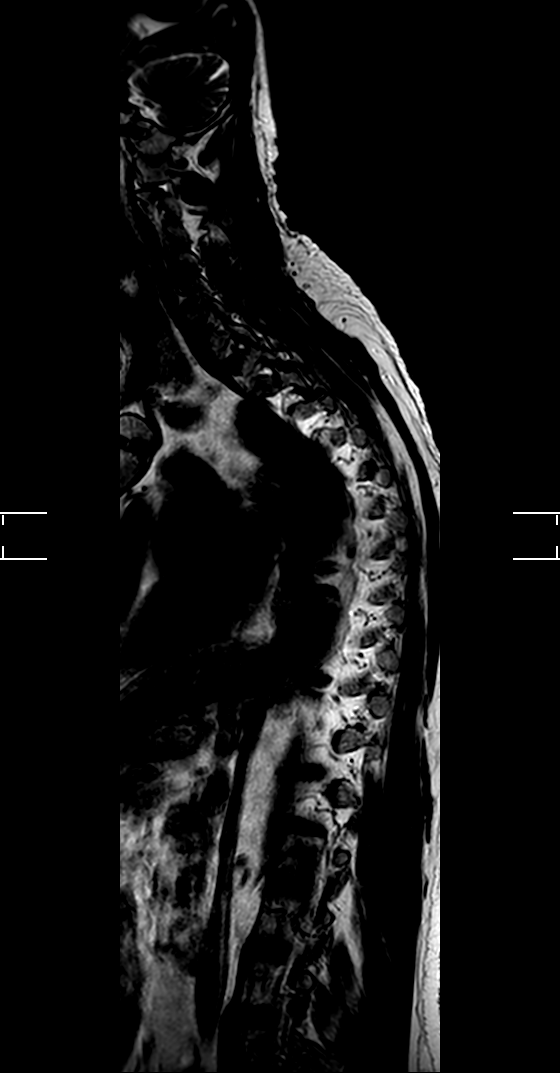

[Series 201: t2_cor_count · coronal · 4.0mm · 0.61mm/px · 2 of 22 slices shown]
[im 1/22]
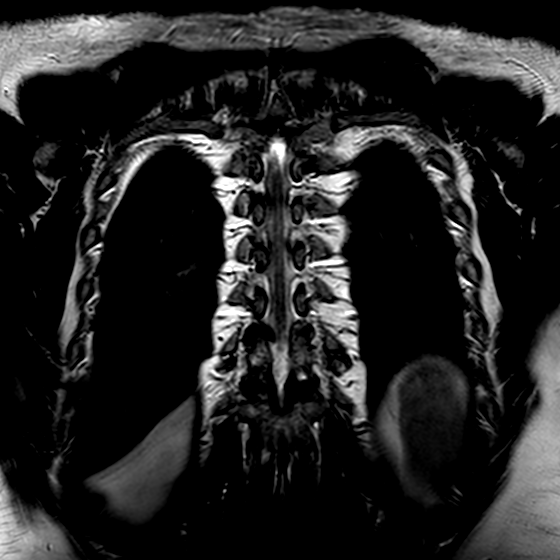
[im 22/22]
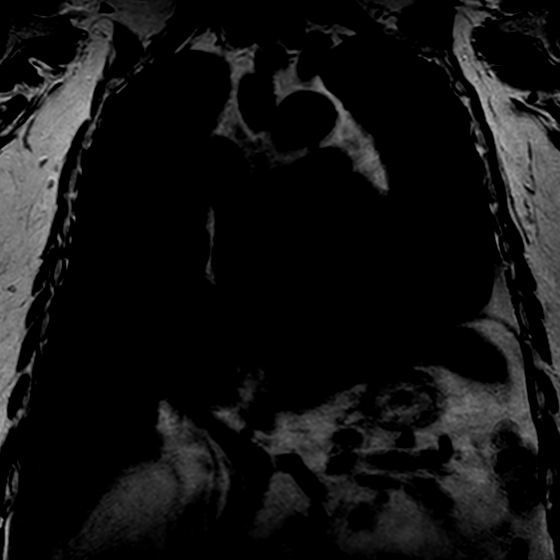

[Series 301: t1_tse_sag · sagittal · 3.0mm · 0.51mm/px · 2 of 21 slices shown]
[im 1/21]
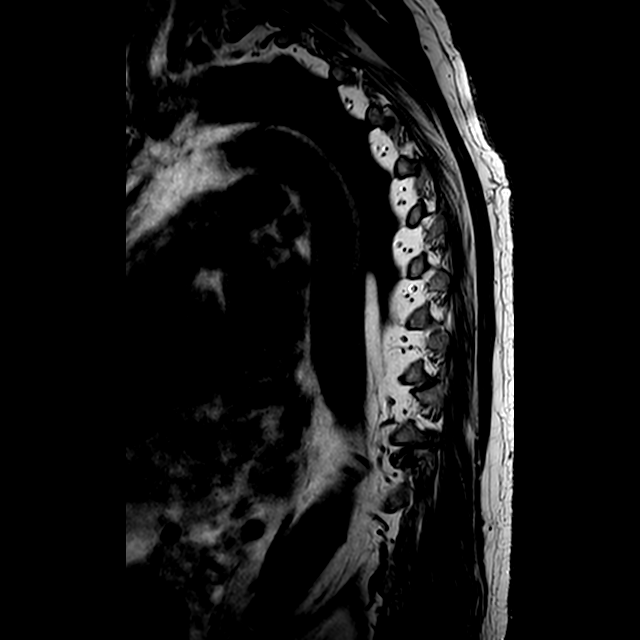
[im 21/21]
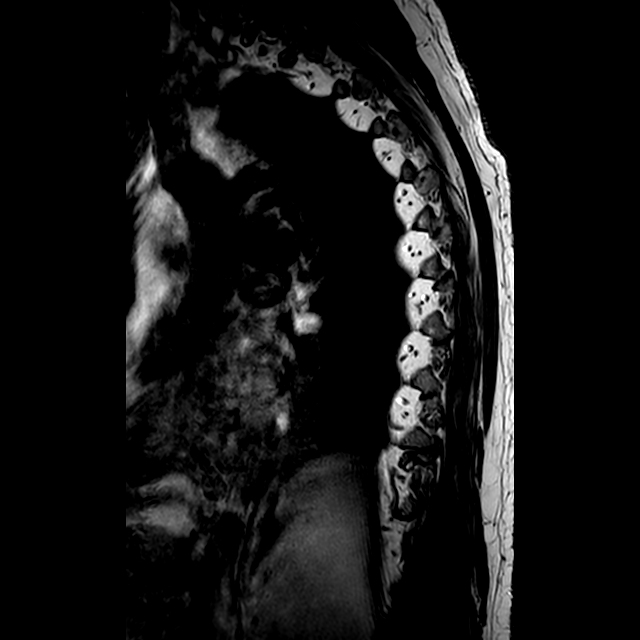

[Series 402: v3d view_t2w tsp · axial · 1.0mm · 0.33mm/px · 1 of 49 slices shown]
[im 1/49]
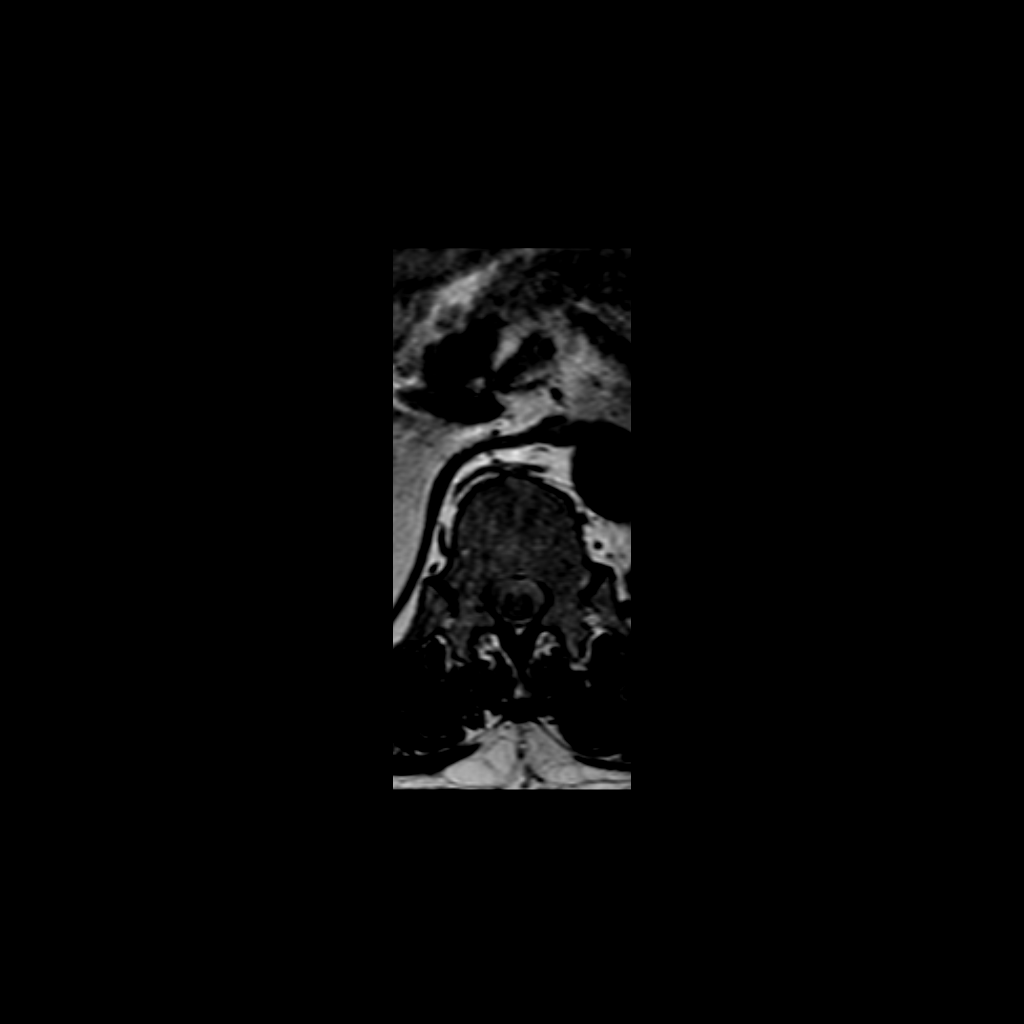

[10 of 48 positions shown; findings below may reference images not displayed]

Thoracic and lumbar radiographs 
December 21, 2021. CT lumbar spine July 08, 2021 and MRI lumbar spine May 04, 2021.
FINDINGS: Prior radiographs demonstrated 12 rib-bearing thoracic type vertebral bodies and 
5 lumbar type vertebral bodies. 
-------------------------------------------------------------------------------- 
------ 
GENERAL: 
ALIGNMENT: There is markedly accentuated thoracic kyphosis related to chronic 
appearing anterior wedge compression deformities of T5 and T6 vertebral bodies. 
There is early osseous bridging across portions of the T5-T6 disc space. The T5 
vertebral body has lost 44% of vertebral body height along its anterior aspect. 
The T6 vertebral body has lost 33% height along the anterior aspect. No osseous 
retropulsion. No marrow edema. The facets at the T5 and T6 levels align 
normally. These findings have created 46.9 degrees of thoracic kyphosis, as 
measured from the superior endplate of T3 through the superior endplate of T9.. 
Anatomic vertebral alignment. 
VERTEBRAL BODY HEIGHT: See above. Other vertebral bodies are preserved in 
height.  
MARROW SIGNAL: No focal suspect signal abnormality. Small hemangioma in T4. 
CORD SIGNAL: Normal. 
ADDITIONAL FINDINGS: Moderate elevation right hemidiaphragm.. 
-------------------------------------------------------------------------------- 
------ 
RELEVANT SEGMENTAL (levels with severe stenosis or significant findings): 
No evidence of critical or significant central canal or foraminal stenosis. 
Multilevel facet arthropathy is present, but without evidence of active facet 
arthritis. There is no dominant disc extrusion or protrusion. 
However, at T6-T7, there is mild generalized annular bulge which is slightly 
eccentric to the right with slight effacement of the right lateral recess. 
At T7-T8, there is extensive facet arthropathy with ligamentum flavum 
hypertrophy that abuts and minimally indents the dorsal cord margin. 
There is no evidence of abnormal enhancement. 
There is mild fluid type signal within the T4-T5 disc space, although the 
endplates appear intact.
IMPRESSION: Markedly accentuated thoracic kyphosis related to chronic appearing anterior 
wedge compression deformities of T5 and T6. There is evidence of early osseous 
bridging across T5-T6 disc space. 
Other thoracic degenerative changes detailed above but without critical or 
significant canal or foraminal stenosis.

## 2021-12-22 IMAGING — MR MRI PELVIS W/WO CONTRAST
4 of 8 series · 12 of 48 positions shown · IV contrast (gadavist)
Comparison: Radiographs of 12/21/2021.

________________________________________________________________________________________________ 
MRI PELVIS W/WO CONTRAST, 12/22/2021 [DATE]: 
CLINICAL INDICATION: Pelvic pain. Lower back and lower extremity radiculopathy, 
chronic.
TECHNIQUE: Multiplanar, multiecho position MR images of the pelvis were 
performed without and with 11.5 mL of intravenous Gadavist were injected by 
hand. 3.5 mL of Gadavist were discarded. Patient was scanned on a 1.5T magnet.

[Series 201: survey · axial · 10.0mm · 1.25mm/px · z∈[-111,+126]mm · 2 of 11 slices shown]
[im 1/11]
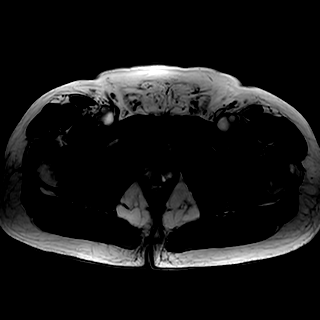
[im 11/11]
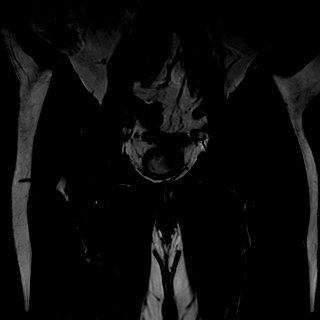

[Series 301: t1_(person_name) · axial · 6.0mm · 0.41mm/px · z∈[-226,+53]mm · 4 of 36 slices shown]
[im 1/36]
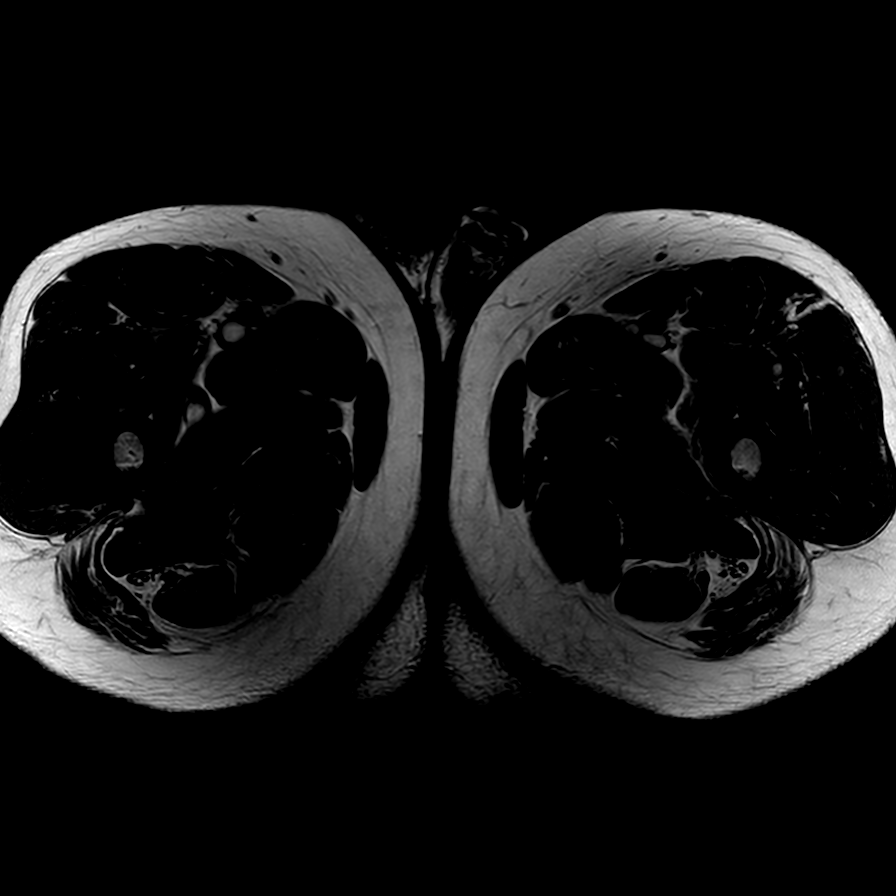
[im 8/36]
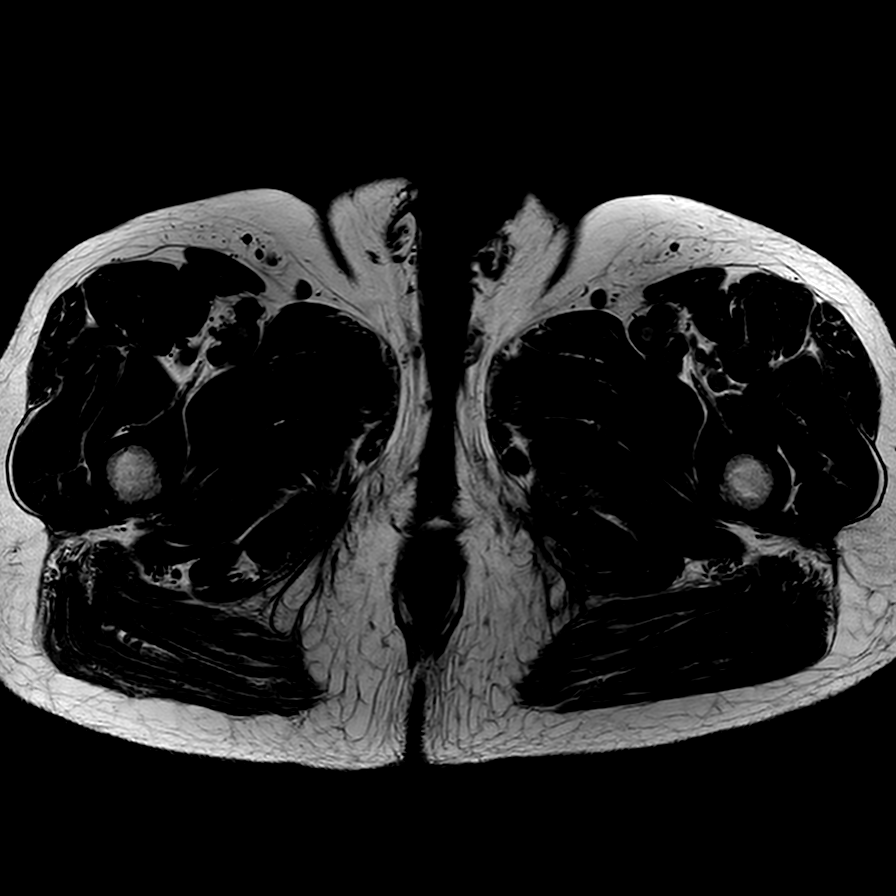
[im 22/36]
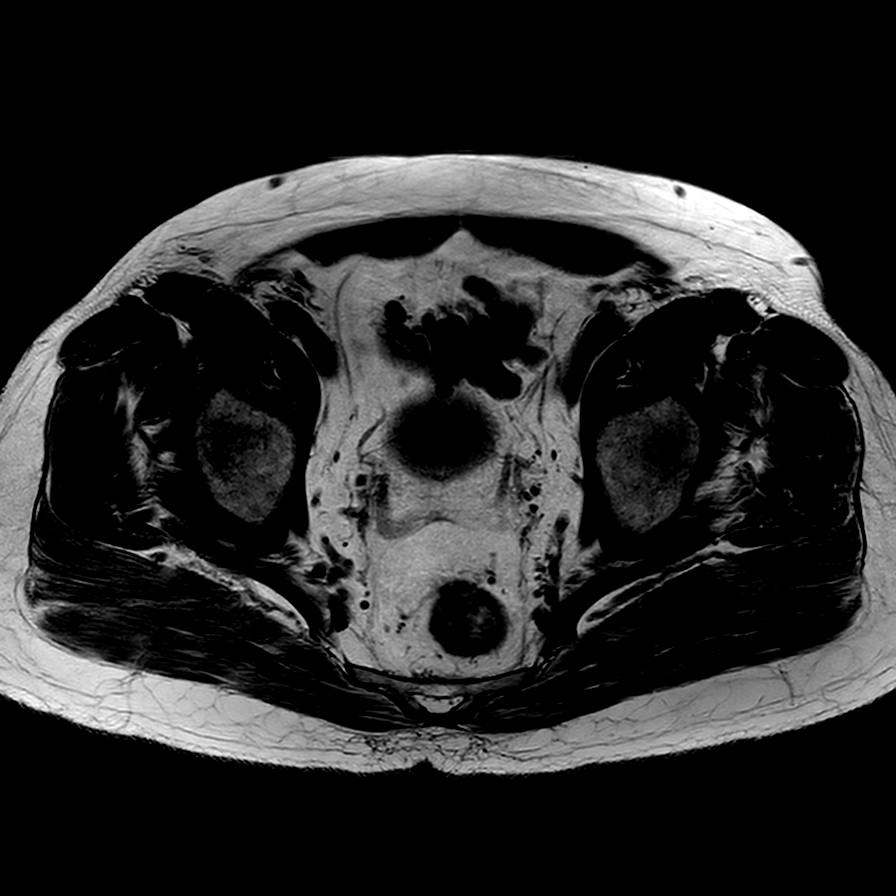
[im 36/36]
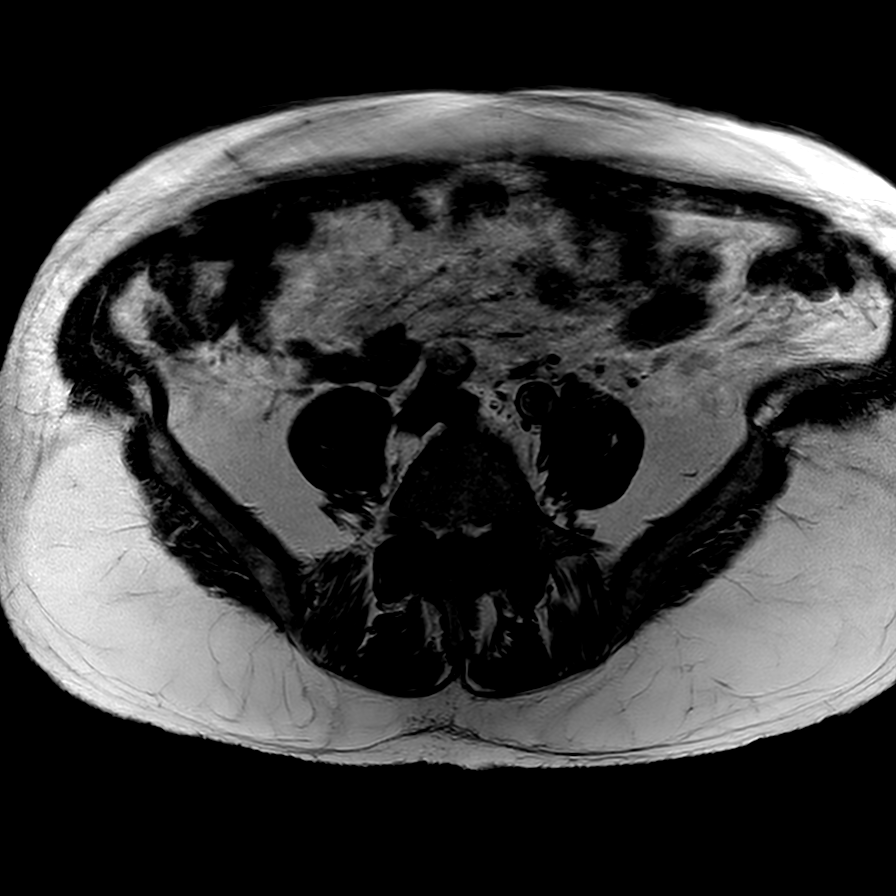

[Series 401: (person_name)_(person_name)_(person_name) · axial · 6.0mm · 0.65mm/px · z∈[-170,+54]mm · 3 of 36 slices shown]
[im 8/36]
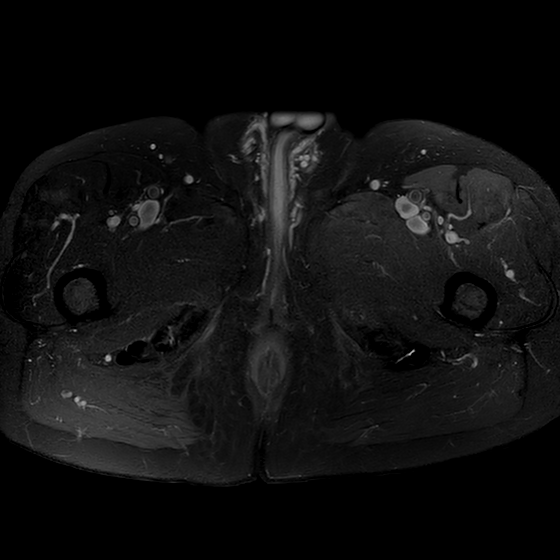
[im 22/36]
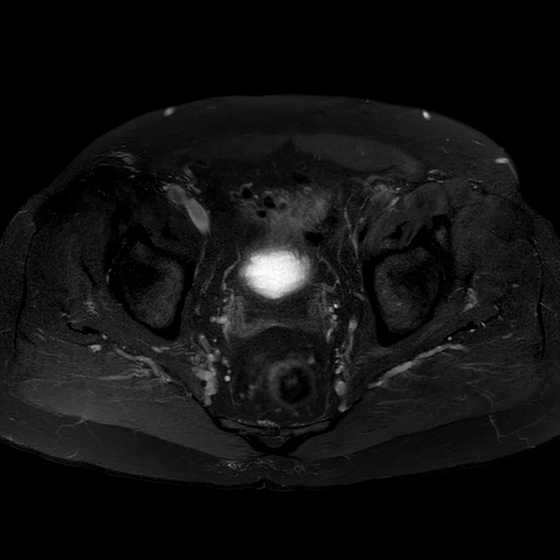
[im 36/36]
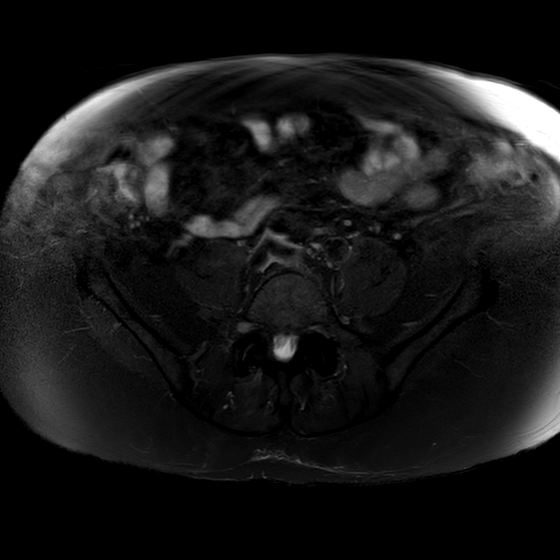

[Series 501: t1_cor · coronal · 5.0mm · 0.62mm/px · 3 of 32 slices shown]
[im 7/32]
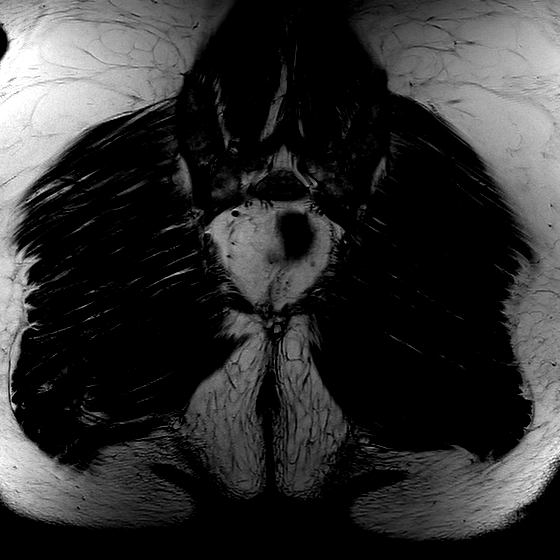
[im 19/32]
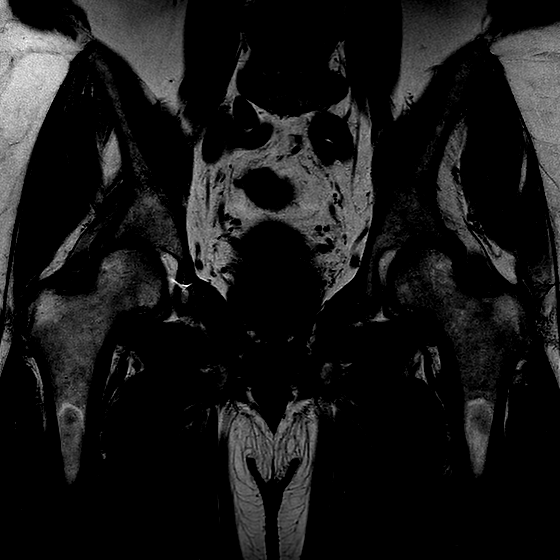
[im 32/32]
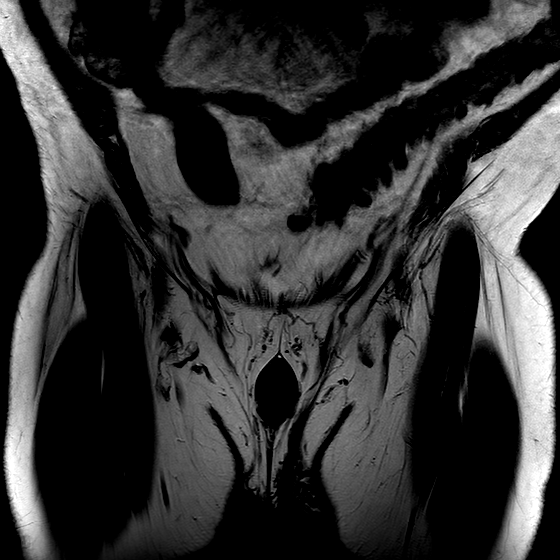

[12 of 48 positions shown; findings below may reference images not displayed]

FINDINGS: HIPS:  No discrete articular cartilaginous loss of either hip. No labral tear. 
No paralabral cyst. No hip joint effusion. Both femoral heads maintain a 
spherical configuration without evidence of avascular necrosis or subarticular 
collapse. No abnormal morphology of the proximal femurs or acetabulum to 
predispose to impingement  
BONES: Normal marrow signal intensity of the proximal hips, pelvis, sacrum and 
included lower lumbar spine. No fracture, contusion or marrow replacing lesion. 
Included lumbar spine demonstrates multilevel degenerative change. SI joints 
show mild degenerative change. 
SOFT TISSUES: The bilateral abductor cuffs are preserved. The insertions of the 
iliopsoas tendons are intact. The origins of the hamstrings are preserved. 
Rectus abdominis-adductor complex is preserved. The musculature is symmetric 
without strain, atrophy or mass. No focal fluid collection or distended bursa. 
Specifically, no iliopsoas or trochanteric bursitis. Included neurovascular 
bundles are negative. Pelvic contents include colonic diverticula without acute 
diverticulitis. The prostate measures up to 6 x 4.5 x 5.6 cm.
IMPRESSION: 1.  No acute abnormality. 
2.  Degenerative changes of the included lower lumbar spine and SI joints. 
3.  Colonic diverticula. 
4.  Posterior hypertrophy.
